# Patient Record
Sex: Male | Born: 1970 | Hispanic: No | Marital: Married | Smoking: Current some day smoker
Health system: Southern US, Community
[De-identification: ages and names within clinical notes are randomized; demographics above are authoritative.]

## PROBLEM LIST (undated history)

## (undated) ENCOUNTER — Ambulatory Visit: Admission: EM

## (undated) DIAGNOSIS — F4310 Post-traumatic stress disorder, unspecified: Secondary | ICD-10-CM

## (undated) DIAGNOSIS — R413 Other amnesia: Secondary | ICD-10-CM

## (undated) DIAGNOSIS — E119 Type 2 diabetes mellitus without complications: Secondary | ICD-10-CM

## (undated) DIAGNOSIS — M2669 Other specified disorders of temporomandibular joint: Secondary | ICD-10-CM

## (undated) DIAGNOSIS — R519 Headache, unspecified: Secondary | ICD-10-CM

## (undated) DIAGNOSIS — E7800 Pure hypercholesterolemia, unspecified: Secondary | ICD-10-CM

## (undated) DIAGNOSIS — E538 Deficiency of other specified B group vitamins: Secondary | ICD-10-CM

## (undated) DIAGNOSIS — K219 Gastro-esophageal reflux disease without esophagitis: Secondary | ICD-10-CM

## (undated) DIAGNOSIS — R41840 Attention and concentration deficit: Secondary | ICD-10-CM

## (undated) DIAGNOSIS — R42 Dizziness and giddiness: Secondary | ICD-10-CM

## (undated) HISTORY — DX: Gastro-esophageal reflux disease without esophagitis: K21.9

## (undated) HISTORY — DX: Type 2 diabetes mellitus without complications: E11.9

## (undated) HISTORY — DX: Deficiency of other specified B group vitamins: E53.8

## (undated) HISTORY — DX: Headache, unspecified: R51.9

## (undated) HISTORY — DX: Other amnesia: R41.3

## (undated) HISTORY — DX: Attention and concentration deficit: R41.840

## (undated) HISTORY — DX: Dizziness and giddiness: R42

## (undated) HISTORY — DX: Post-traumatic stress disorder, unspecified: F43.10

## (undated) HISTORY — DX: Pure hypercholesterolemia, unspecified: E78.00

## (undated) HISTORY — DX: Other specified disorders of temporomandibular joint: M26.69

---

## 2004-04-04 HISTORY — PX: NASAL SEPTUM SURGERY: SHX37

## 2015-05-21 ENCOUNTER — Other Ambulatory Visit: Payer: Self-pay | Admitting: Family Medicine

## 2015-05-21 ENCOUNTER — Ambulatory Visit
Admission: RE | Admit: 2015-05-21 | Discharge: 2015-05-21 | Disposition: A | Discharge status: Home / Self Care | Payer: Medicaid Other | Source: Ambulatory Visit | Attending: Family Medicine | Admitting: Family Medicine

## 2015-05-21 DIAGNOSIS — M5430 Sciatica, unspecified side: Secondary | ICD-10-CM

## 2016-05-06 ENCOUNTER — Ambulatory Visit (INDEPENDENT_AMBULATORY_CARE_PROVIDER_SITE_OTHER): Payer: Self-pay | Admitting: Internal Medicine

## 2016-05-06 ENCOUNTER — Encounter: Payer: Self-pay | Admitting: Internal Medicine

## 2016-05-06 VITALS — BP 122/80 | HR 70 | Resp 12 | Ht 67.0 in | Wt 238.0 lb

## 2016-05-06 DIAGNOSIS — M545 Low back pain: Secondary | ICD-10-CM

## 2016-05-06 DIAGNOSIS — H547 Unspecified visual loss: Secondary | ICD-10-CM | POA: Insufficient documentation

## 2016-05-06 DIAGNOSIS — G8929 Other chronic pain: Secondary | ICD-10-CM

## 2016-05-06 DIAGNOSIS — M79605 Pain in left leg: Secondary | ICD-10-CM | POA: Insufficient documentation

## 2016-05-06 DIAGNOSIS — M79604 Pain in right leg: Secondary | ICD-10-CM

## 2016-05-06 DIAGNOSIS — H6983 Other specified disorders of Eustachian tube, bilateral: Secondary | ICD-10-CM

## 2016-05-06 NOTE — Progress Notes (Signed)
Subjective:    Patient ID: Jeffrey Henry, male    DOB: [birth year], 46 y.o.   MRN: [id number]  HPI   Here to establish.  1.  Leg and knee pain:  Started 3 weeks ago during cold snap.  Bilateral anterior tibial area, then moved to involve knees bilaterally.  Rubbed olive oil to the area and helped a bit.  Anterior knee also involved.  No swelling. Hurts most on cold days and when he is standing.    2.  Decreased visual acuity:  Close vision is blurry.  Needs to hold things away from him.  Has not tried reading glasses.    3.  Hearing concerns:  Had cold symptoms about 1 month ago and feels ears are plugged since--pressure sense.  No "popping".  Gives him a headache.  He does use qtips in ears every day.  4.  Back pain:  Chronic issue.  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   Has been evaluated by scan in past from clinic on [address] and was told there were no concerns.  He did have Lumbar films 2/217 that were unremarkable and noted in his chart.   He went to PT once, but Medicaid did not cover for more and would like to get back to that. No numbness, tingling or weakness in legs or feet.  Current Meds  Medication Sig  . cholecalciferol (VITAMIN D) 1000 units tablet Take 1,000 Units by mouth once.   No Known Allergies    PMH/PSH:   Unremarkable.  Social History   Social History  . Marital status: Married    Spouse name: Beverely Low  . Number of children: 2  . Years of education: 6   Occupational History  . Photographer    Social History Main Topics  . Smoking status: Never Smoker  . Smokeless tobacco: Current User     Comment: smokes hookah ocassionally   . Alcohol use No  . Drug use: No  . Sexual activity: Not on file   Other Topics Concern  . Not on file   Social History Narrative   Originally from [place]   Came to [employer] [place]. In 2016.   Lives at home with wife and 2 children    5 or 6 children:  1 died at age 63--killed by a bomb.   2-3 died in infancy--became weak and not clear what cause.     Review of Systems     Objective:   Physical Exam  NAD HEENT:  PERRL, EOMI, discs sharp.   TMs with scarring somewhat on right, but without thickening or erythema.  Left TM pearly gray.  Throat without injection.  Significant wear of teeth. Neck:  Supple, No adenopathy Chest:  CTA CV:  RRR with normal S1 and S2, No S3, S4 or murmur.  Radial pulses and DP pulses normal and equal. Back:  NT over spinous processes, tender over paraspinous musculature, L/S spine area.  Full ROM. Legs/knees:  No erythema or swelling of pretib area or knees.  Full ROM.  No tenderness or laxity with stress of cruciate or collateral ligaments.  NT on compression of patella against anterior knee joint. Neuro:  A & O X3, CN II-XII grossly intact, DTRs 2+/4 throughout, motor 5/5 throughout, sensory grossly normal.        Assessment & Plan:  1.  Low back pain:  Referral to [facility].  Ibuprofen 400 to  800 mg every 6 hours as needed for pain.  2.  Knee and anterior tibial pain:  Ibuprofen as above, plus menthol and camphor rub to areas as needed as seems associated with cold weather.  3.  Eustachian tube dysfunction:  Discussed should resolved with time.  If no improvement in 1 month, referral for audiology.  4.  Decreased Visual Acuity:  Optometry referral.  Suspect will need bifocals.

## 2016-05-06 NOTE — Patient Instructions (Addendum)
Menthol and camphor containing muscle/joint rub to legs and knees as needed Ibuprofen 200 mg 2-4 tabs by mouth every 6 hours as needed with food for pain Call clinic in 1 month if your hearing does not improve

## 2016-05-15 IMAGING — CR DG LUMBAR SPINE COMPLETE 4+V
5 series · 5 of 5 positions shown · non-contrast
Comparison: None.

CLINICAL DATA: Chronic low back pain with bilateral sciatica. Pain
radiating to both feet.

EXAM:
LUMBAR SPINE - COMPLETE 4+ VIEW

[w lumbar spine ap]
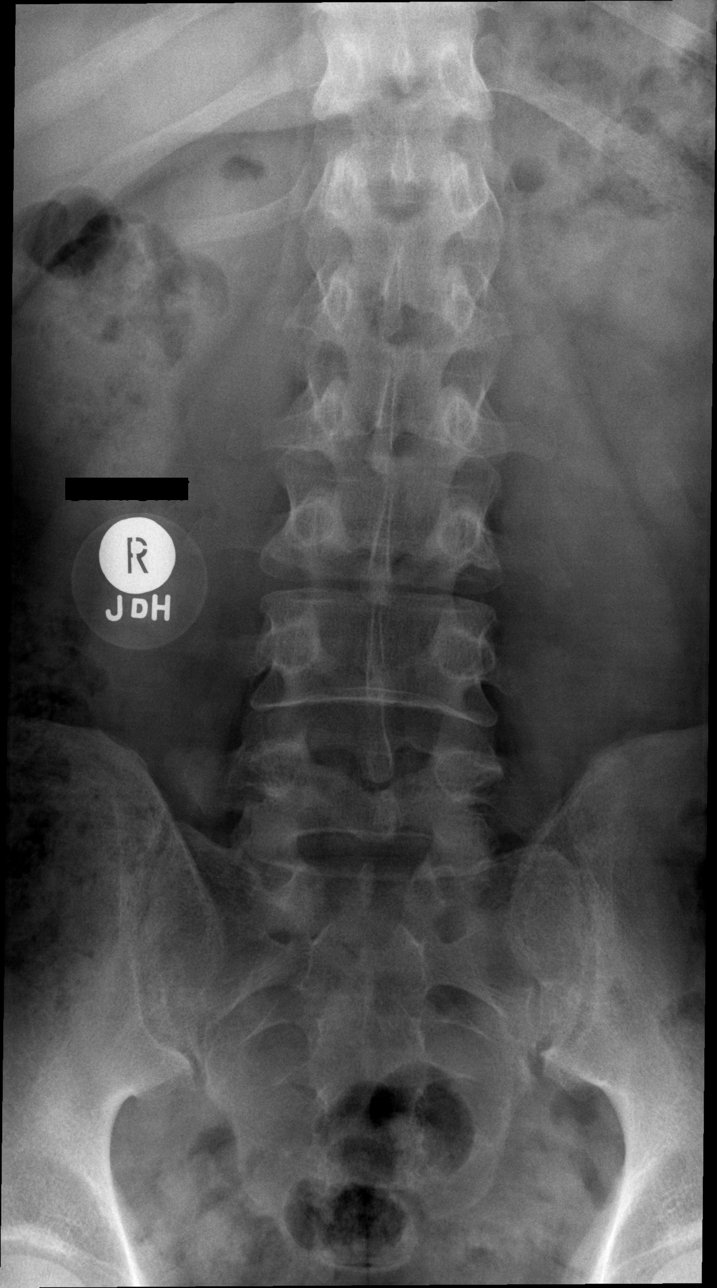

[w lumbar spine obl (1 of 2)]
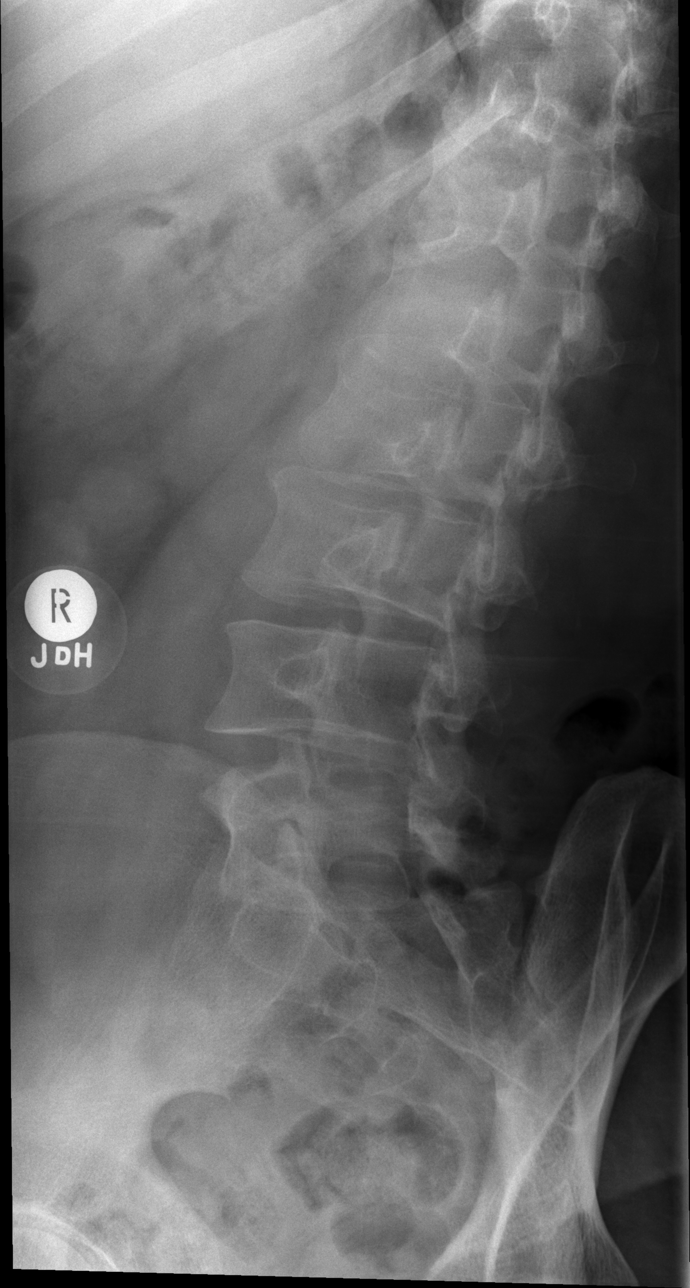

[w lumbar spine obl (2 of 2)]
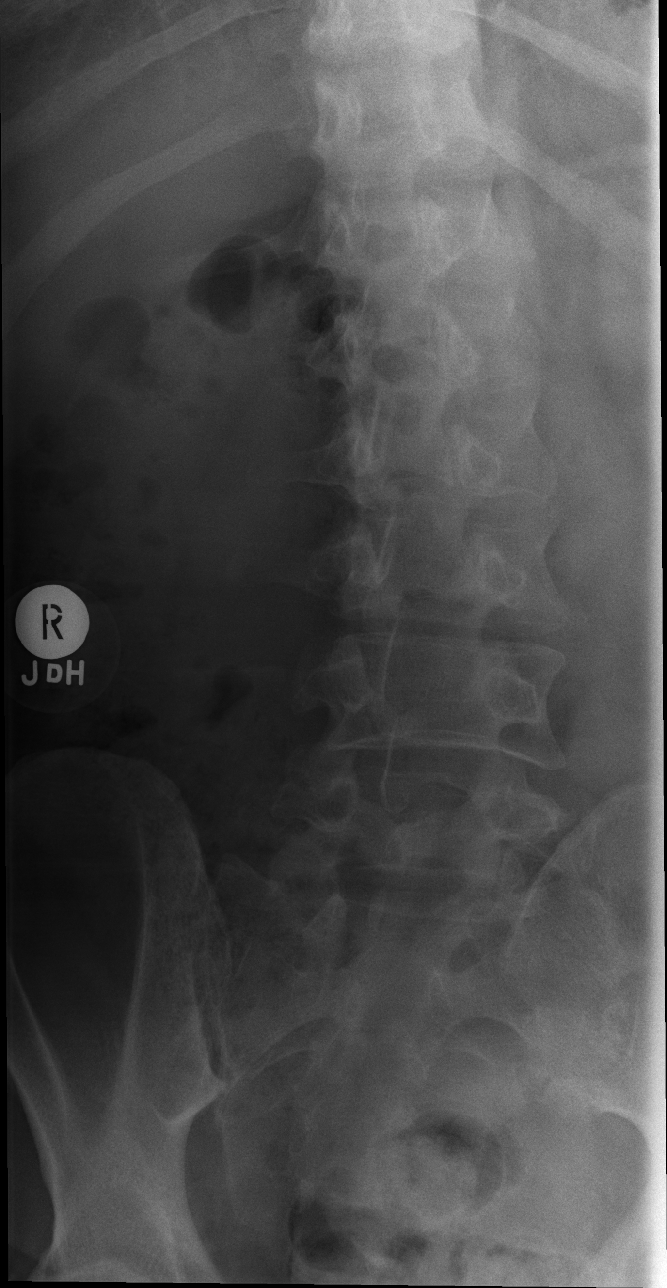

[w lumbar spine lat]
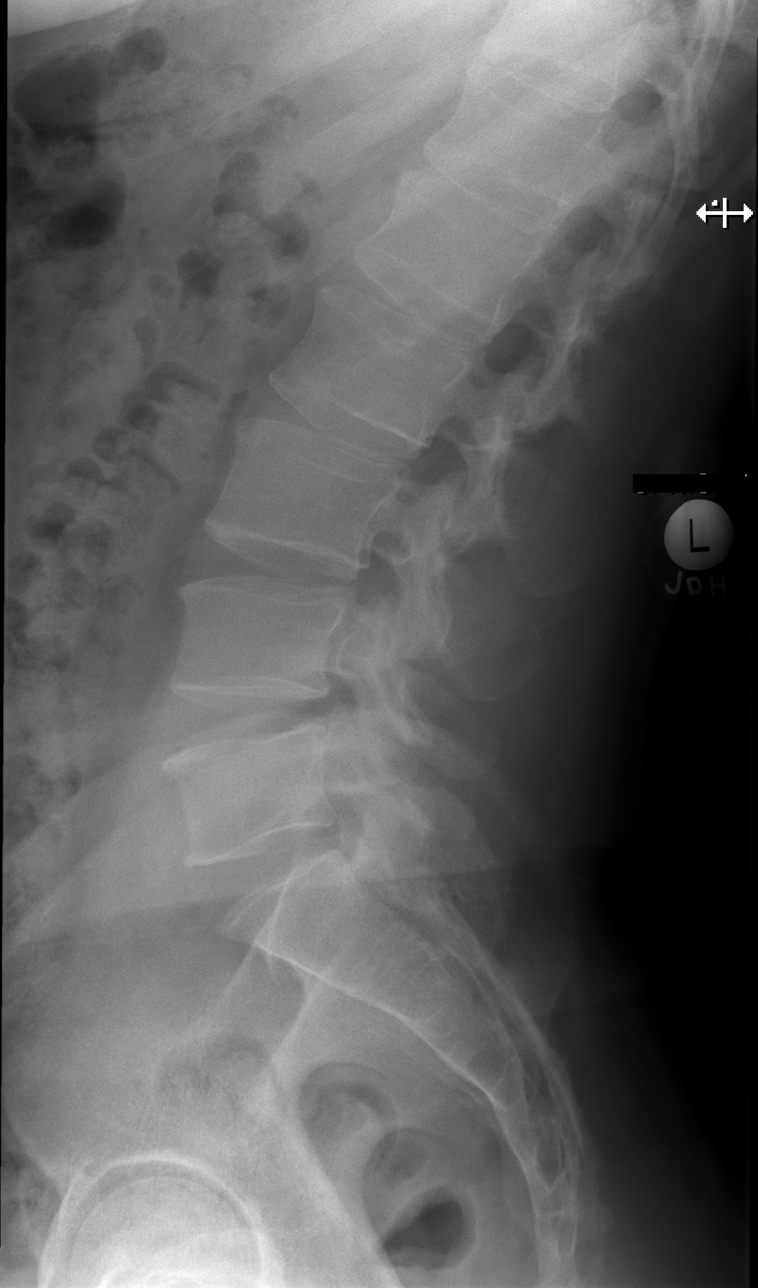

[w lumbar l-5 s-1 spot]
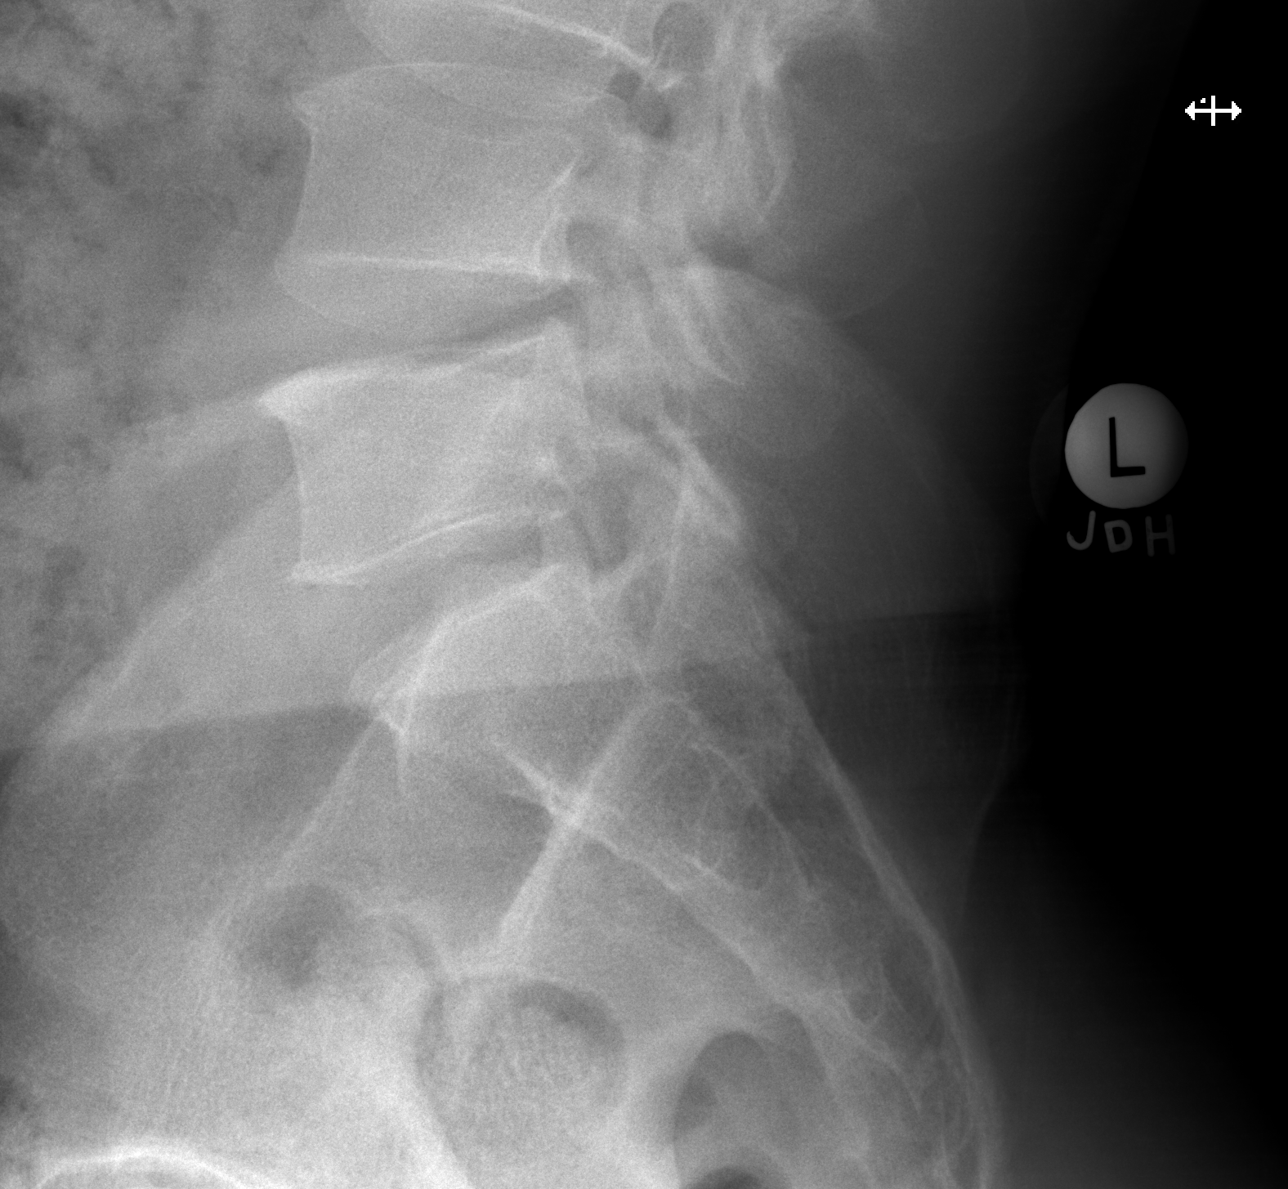

[5 of 5 positions shown; findings below may reference images not displayed]

FINDINGS: There is no evidence of lumbar spine fracture. Alignment is normal.
Intervertebral disc spaces are maintained. No significant facet
arthropathy or other bone lesions identified.
IMPRESSION: Negative lumbar spine radiographs.

## 2016-06-03 ENCOUNTER — Ambulatory Visit: Payer: Self-pay | Admitting: Internal Medicine

## 2016-07-05 ENCOUNTER — Encounter: Payer: Self-pay | Admitting: Internal Medicine

## 2016-07-05 ENCOUNTER — Ambulatory Visit (INDEPENDENT_AMBULATORY_CARE_PROVIDER_SITE_OTHER): Payer: Self-pay | Admitting: Internal Medicine

## 2016-07-05 VITALS — BP 112/70 | HR 72 | Resp 12 | Ht 67.0 in | Wt 236.0 lb

## 2016-07-05 DIAGNOSIS — K047 Periapical abscess without sinus: Secondary | ICD-10-CM

## 2016-07-05 DIAGNOSIS — M2669 Other specified disorders of temporomandibular joint: Secondary | ICD-10-CM

## 2016-07-05 DIAGNOSIS — M26649 Arthritis of unspecified temporomandibular joint: Secondary | ICD-10-CM

## 2016-07-05 HISTORY — DX: Arthritis of unspecified temporomandibular joint: M26.649

## 2016-07-05 MED ORDER — PENICILLIN V POTASSIUM 250 MG PO TABS: 250.0000 mg | ORAL_TABLET | Freq: Four times a day (QID) | ORAL | 0 refills | Status: DC

## 2016-07-05 MED ORDER — IBUPROFEN 200 MG PO TABS: ORAL_TABLET | ORAL | 0 refills | Status: DC

## 2016-07-05 NOTE — Patient Instructions (Signed)
Ask for Bite block to wear at night with sleep at [facility]

## 2016-07-05 NOTE — Progress Notes (Signed)
   Subjective:    Patient ID: Jeffrey Henry, male    DOB: 12-Dec-1970, 46 y.o.   MRN: [id number]  HPI   Jeffrey Henry, interprets.    1.  Pain in right upper premolar for 2 days.  Has been to dentist in past for same tooth.  Sounds like had a cavity that was cleared and filled, not a root canal.  Filling has since fallen out. States went to the dentist for this 2 months ago on his own.   Taking Ibuprofen 400 mg when pain is severe.  Helps a little.   Has not noted any pustular drainage or swelling/redness of gingiva.  Feels like he is getting food caught in area where filling fell out--causes pain.  Having difficulty cleaning his teeth as hurts to clean.  2.  He states his hearing is better, though still not normal out of right ear.  Has not been congested since last seen on 05/06/2016.  No outpatient prescriptions have been marked as taking for the 07/05/16 encounter (Office Visit) with Julieanne Manson, MD.   No Known Allergies  .      Review of Systems     Objective:   Physical Exam HEENT:  PERRL, EOMI, TMs with scarring a bit on right, but without inflammation and clear, left is pearly gray.  Throat without injection, Does have a hole that looks fairly clean in a right upper premolar.  No swelling or fluctuance, but pain with manipulation of tooth.  Bite surfaces well worn. Audible crunch with opening of mouth--emanated from left TMJ Neck:  Supple No adenopathy Chest:  CTA CV: RRR without murmur or rub, radial pulses normal and equal       Assessment & Plan:  1.  Dental abscess with exposed cavity, right upper premolar.  Penicillin V K 250 mg 4 times daily for 7 days.  Dental referral.  Ibuprofen for pain.  2.  Right decreased hearing:  Eustachian tube dysfunction is likely main cause.  If does not continue to improve, will send to audiology.  3.  Left TMJ:  Shares he had this injected in the past.  Discussed getting a bite block to wear at night--denies grinding teeth,  but suspect he does.

## 2016-07-23 ENCOUNTER — Encounter: Payer: Self-pay | Admitting: Internal Medicine

## 2016-08-05 ENCOUNTER — Ambulatory Visit (INDEPENDENT_AMBULATORY_CARE_PROVIDER_SITE_OTHER): Payer: Self-pay | Admitting: Internal Medicine

## 2016-08-05 ENCOUNTER — Encounter: Payer: Self-pay | Admitting: Internal Medicine

## 2016-08-05 VITALS — BP 112/78 | HR 72 | Resp 12 | Ht 67.0 in | Wt 235.0 lb

## 2016-08-05 DIAGNOSIS — G8929 Other chronic pain: Secondary | ICD-10-CM

## 2016-08-05 DIAGNOSIS — H547 Unspecified visual loss: Secondary | ICD-10-CM

## 2016-08-05 DIAGNOSIS — H6983 Other specified disorders of Eustachian tube, bilateral: Secondary | ICD-10-CM

## 2016-08-05 DIAGNOSIS — M545 Low back pain: Secondary | ICD-10-CM

## 2016-08-05 DIAGNOSIS — J3089 Other allergic rhinitis: Secondary | ICD-10-CM

## 2016-08-05 DIAGNOSIS — M79604 Pain in right leg: Secondary | ICD-10-CM

## 2016-08-05 DIAGNOSIS — M79605 Pain in left leg: Secondary | ICD-10-CM

## 2016-08-05 DIAGNOSIS — Z720 Tobacco use: Secondary | ICD-10-CM

## 2016-08-05 DIAGNOSIS — H6980 Other specified disorders of Eustachian tube, unspecified ear: Secondary | ICD-10-CM | POA: Insufficient documentation

## 2016-08-05 MED ORDER — CYCLOBENZAPRINE HCL 5 MG PO TABS: ORAL_TABLET | ORAL | 1 refills | Status: DC

## 2016-08-05 MED ORDER — MOMETASONE FUROATE 50 MCG/ACT NA SUSP: NASAL | 12 refills | Status: DC

## 2016-08-05 NOTE — Progress Notes (Signed)
   Subjective:    Patient ID: Jeffrey Henry, male    DOB: Dec 05, 1970, 46 y.o.   MRN: [id number]  HPI   1.  Dental infection/pain:  Better since antibiotics.  Was seen at dental clinic and planning for fillings to be place.    2.  Right hearing decreased:  Better, though still feels pressure.  States at about 70%.  No throat irritation.  No sinus or nasal congestion.    3.  Hookah smoking:  4-5 times daily when not at work.  3 times if working.  Sits and watches TV often as nothing to do.   4.  Back pain:  [facility]:  TENS unit, exercises.  Seems to have made this worse.  Having problems now sleeping with pain. Now having pain also with any walking.  That did not bother him before.  No radiation really into legs.  Cannot remember name of therapist.    5.  Bilateral knee pain :  Improved.  No consistent as before.  Topical muscle rubs helped.    6.  Decreased visual acuity:  Did get glasses.  Bifocals.  Has not gotten used to them yet.  No outpatient prescriptions have been marked as taking for the 08/05/16 encounter (Office Visit) with Julieanne MansonElizabeth Elham Fini, MD.    No Known Allergies Review of Systems     Objective:   Physical Exam  Constitutional: He is oriented to person, place, and time.  overweight  HENT:  Head: Normocephalic and atraumatic.  Right Ear: Tympanic membrane is retracted (right most prominent).  Left Ear: Tympanic membrane is retracted.  Nose: Mucosal edema present.  Mouth/Throat: Uvula is midline and oropharynx is clear and moist.  Eyes: Conjunctivae and EOM are normal. Pupils are equal, round, and reactive to light.  Neck: Normal range of motion and full passive range of motion without pain. Neck supple.  Cardiovascular: Normal rate, regular rhythm, S1 normal and S2 normal.  Exam reveals no S3, no S4 and no friction rub.   No murmur heard. Pulmonary/Chest: Effort normal and breath sounds normal.  Abdominal: Soft. Bowel sounds are  normal. He exhibits no mass. There is no tenderness.  Musculoskeletal:  NT over L/S spinous processes.  Tender over right more so than left paraspinous musculature with right musculature spasm  Lymphadenopathy:    He has no cervical adenopathy.  Neurological: He is alert and oriented to person, place, and time. He has normal reflexes. No cranial nerve deficit. Coordination normal.         Assessment & Plan:  1.  Dental:  Improved.  Cavities being addressed.  2.  Vision:  Encouraged to use bifocals regularly so gets used to them  3.  Allergies:  Nasonex 2 sprays each nostril daily.    4.  Eustachian tube dysfunction, R>L.  Improving.  5.  Low back pain:  Muscular.  PT he feels is not helpful. Not clear how much he is actually doing at home with exercises.  Encouraged him not to decrease activity.  Flexeril for bedtime See if have received old records with reportedly normal MRI Referral to [facility] as seems to have failed PT.    6.  Knee pain:  Improved

## 2017-01-13 ENCOUNTER — Ambulatory Visit: Payer: Self-pay

## 2017-01-23 ENCOUNTER — Ambulatory Visit (INDEPENDENT_AMBULATORY_CARE_PROVIDER_SITE_OTHER): Payer: Self-pay

## 2017-01-23 DIAGNOSIS — Z23 Encounter for immunization: Secondary | ICD-10-CM

## 2017-03-17 ENCOUNTER — Ambulatory Visit: Payer: Self-pay | Admitting: Internal Medicine

## 2017-04-27 ENCOUNTER — Emergency Department (HOSPITAL_COMMUNITY)
Admission: EM | Admit: 2017-04-27 | Discharge: 2017-04-27 | Disposition: A | Discharge status: Home / Self Care | Payer: Self-pay | Attending: Emergency Medicine | Admitting: Emergency Medicine

## 2017-04-27 ENCOUNTER — Encounter (HOSPITAL_COMMUNITY): Payer: Self-pay

## 2017-04-27 ENCOUNTER — Other Ambulatory Visit: Payer: Self-pay

## 2017-04-27 DIAGNOSIS — N200 Calculus of kidney: Secondary | ICD-10-CM | POA: Insufficient documentation

## 2017-04-27 DIAGNOSIS — Z79899 Other long term (current) drug therapy: Secondary | ICD-10-CM | POA: Insufficient documentation

## 2017-04-27 LAB — URINALYSIS, ROUTINE W REFLEX MICROSCOPIC
Bilirubin Urine: NEGATIVE
GLUCOSE, UA: NEGATIVE mg/dL
KETONES UR: NEGATIVE mg/dL
Leukocytes, UA: NEGATIVE
Nitrite: NEGATIVE
PROTEIN: NEGATIVE mg/dL
SQUAMOUS EPITHELIAL / LPF: NONE SEEN
Specific Gravity, Urine: 1.01 (ref 1.005–1.030)
pH: 7 (ref 5.0–8.0)

## 2017-04-27 MED ORDER — ONDANSETRON HCL 4 MG/2ML IJ SOLN: 4.0000 mg | Freq: Once | INTRAMUSCULAR | Status: DC

## 2017-04-27 MED ORDER — SODIUM CHLORIDE 0.9 % IV BOLUS (SEPSIS): 1000.0000 mL | Freq: Once | INTRAVENOUS | Status: DC

## 2017-04-27 MED ORDER — KETOROLAC TROMETHAMINE 30 MG/ML IJ SOLN: 30.0000 mg | Freq: Once | INTRAMUSCULAR | Status: DC

## 2017-04-27 MED ORDER — TAMSULOSIN HCL 0.4 MG PO CAPS: 0.4000 mg | ORAL_CAPSULE | Freq: Every day | ORAL | 0 refills | Status: AC

## 2017-04-27 NOTE — ED Notes (Signed)
Wall-E used to assist in translating discharge paperwork and go over d/c instructions

## 2017-04-27 NOTE — ED Notes (Signed)
Bed: WA01 Expected date:  Expected time:  Means of arrival:  Comments: 

## 2017-04-27 NOTE — ED Triage Notes (Signed)
Per EMS-states right flank pain radiating to back with dysuria since this am-states genital pain as well-no nausea-vomited this am

## 2017-04-27 NOTE — ED Provider Notes (Signed)
[facility]-EMERGENCY DEPT Provider Note   CSN: [id number] Arrival date & time: 04/27/17  1147     History   Chief Complaint Chief Complaint  Patient presents with  . Flank Pain    HPI Jeffrey Henry is a 47 y.o. male.  HPI   Right side kidney pain, hurts in the back, radiates to front but is less severe in front Also has some dysuria 8 or 10AM had pain with urination Testicles hurt, no history of trauma Had hx of kidney stones 10 yrs ago, had to remove from urethra Now urinated it improved Now pain has resolved except some pain in testicles 2 hours ago was in excrutiating pain and took ibuprofen and it helped Reports pain has completely resolved now  Used arabic interpreter  Past Medical History:  Diagnosis Date  . 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 Eustachian tube dysfunction 08/05/2016  . 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 Decreased visual acuity 05/06/2016  . Chronic midline low back pain without sciatica 05/06/2016  . Pain in both lower extremities 05/06/2016    Past Surgical History:  Procedure Laterality Date  . NASAL SEPTUM SURGERY  2006   [place]       Home Medications    Prior to Admission medications   Medication Sig Start Date End Date Taking? Authorizing Provider  cyclobenzaprine (FLEXERIL) 5 MG tablet 1-2 tabs by mouth at bedtime for back pain Patient not taking: Reported on 04/27/2017 08/05/16   Julieanne Manson, MD  mometasone (NASONEX) 50 MCG/ACT nasal spray 2 sprays each nostril daily Patient not taking: Reported on 04/27/2017 08/05/16   Julieanne Manson, MD  tamsulosin (FLOMAX) 0.4 MG CAPS capsule Take 1 capsule (0.4 mg total) by mouth daily. 04/27/17   Alvira Monday, MD    Family History Family History  Problem Relation Age of Onset  . Diabetes Mother   . Heart disease Mother   . Hyperlipidemia Mother   . Hypertension Mother   . Diabetes Father   . Peripheral Artery  Disease Father     Social History Social History   Tobacco Use  . Smoking status: Never Smoker  . Smokeless tobacco: Current User  . Tobacco comment: smokes hookah daily  Substance Use Topics  . Alcohol use: No  . Drug use: No     Allergies   Patient has no known allergies.   Review of Systems Review of Systems  Constitutional: Negative for fever.  HENT: Negative for sore throat.   Eyes: Negative for visual disturbance.  Respiratory: Negative for shortness of breath.   Cardiovascular: Negative for chest pain.  Gastrointestinal: Positive for nausea (with pain). Negative for abdominal pain and vomiting.  Genitourinary: Positive for dysuria, flank pain and testicular pain. Negative for difficulty urinating.  Musculoskeletal: Negative for back pain and neck stiffness.  Skin: Negative for rash.  Neurological: Negative for syncope and headaches.     Physical Exam Updated Vital Signs BP (!) 149/98 (BP Location: Right Arm)   Pulse 70   Temp 98.1 F (36.7 C) (Oral)   Resp 17   Ht 5\' 11"  (1.803 m)   Wt 110 kg (242 lb 8.1 oz)   SpO2 98%   BMI 33.82 kg/m   Physical Exam  Constitutional: He is oriented to person, place, and time. He appears well-developed and well-nourished. No distress.  HENT:  Head: Normocephalic and atraumatic.  Eyes: Conjunctivae and EOM are normal.  Neck: Normal range  of motion.  Cardiovascular: Normal rate, regular rhythm, normal heart sounds and intact distal pulses. Exam reveals no gallop and no friction rub.  No murmur heard. Pulmonary/Chest: Effort normal and breath sounds normal. No respiratory distress. He has no wheezes. He has no rales.  Abdominal: Soft. He exhibits no distension. There is no tenderness. There is no guarding.  Genitourinary: Testes normal. Cremasteric reflex is present. Right testis shows no tenderness. Left testis shows no tenderness.  Musculoskeletal: He exhibits no edema.  Neurological: He is alert and oriented to  person, place, and time.  Skin: Skin is warm and dry. He is not diaphoretic.  Nursing note and vitals reviewed.    ED Treatments / Results  Labs (all labs ordered are listed, but only abnormal results are displayed) Labs Reviewed  URINALYSIS, ROUTINE W REFLEX MICROSCOPIC - Abnormal; Notable for the following components:      Result Value   Hgb urine dipstick LARGE (*)    Bacteria, UA RARE (*)    All other components within normal limits  URINE CULTURE    EKG  EKG Interpretation None       Radiology No results found.  Procedures Procedures (including critical care time)  Medications Ordered in ED Medications - No data to display   Initial Impression / Assessment and Plan / ED Course  I have reviewed the triage vital signs and the nursing notes.  Pertinent labs & imaging results that were available during my care of the patient were reviewed by me and considered in my medical decision making (see chart for details).     47 yo male who speaks arabic presents with concern for right flank pain and dysuria. UA shows no sign of infection, does show hematuria.  Lab work done which was within normal limits, that his pain resolved after taking ibuprofen in the waiting room.  Given patient pain free at this time, I do not feel that further imaging versus care.  Discussed that patient likely by clinical history of nephrolithiasis, possibly passed a kidney stone.  Patient states understanding. No sign of cholecystitis, appendicitis, testicular pathology. He was given a prescription for Flomax and number for urology for follow-up. Patient discharged in stable condition with understanding of reasons to return.   Final Clinical Impressions(s) / ED Diagnoses   Final diagnoses:  Nephrolithiasis    ED Discharge Orders        Ordered    tamsulosin (FLOMAX) 0.4 MG CAPS capsule  Daily     04/27/17 1448       Alvira MondaySchlossman, Jayci Ellefson, MD 04/27/17 1806

## 2017-04-28 LAB — URINE CULTURE: Culture: NO GROWTH

## 2018-02-25 ENCOUNTER — Other Ambulatory Visit: Payer: Self-pay

## 2018-02-25 ENCOUNTER — Encounter (HOSPITAL_COMMUNITY): Payer: Self-pay

## 2018-02-25 ENCOUNTER — Ambulatory Visit (HOSPITAL_COMMUNITY)
Admission: EM | Admit: 2018-02-25 | Discharge: 2018-02-25 | Disposition: A | Discharge status: Home / Self Care | Payer: Self-pay | Attending: Family Medicine | Admitting: Family Medicine

## 2018-02-25 DIAGNOSIS — G8929 Other chronic pain: Secondary | ICD-10-CM | POA: Insufficient documentation

## 2018-02-25 DIAGNOSIS — Z79899 Other long term (current) drug therapy: Secondary | ICD-10-CM | POA: Insufficient documentation

## 2018-02-25 DIAGNOSIS — M545 Low back pain: Secondary | ICD-10-CM | POA: Insufficient documentation

## 2018-02-25 DIAGNOSIS — J029 Acute pharyngitis, unspecified: Secondary | ICD-10-CM | POA: Insufficient documentation

## 2018-02-25 DIAGNOSIS — K219 Gastro-esophageal reflux disease without esophagitis: Secondary | ICD-10-CM | POA: Insufficient documentation

## 2018-02-25 DIAGNOSIS — Z7951 Long term (current) use of inhaled steroids: Secondary | ICD-10-CM | POA: Insufficient documentation

## 2018-02-25 DIAGNOSIS — M26629 Arthralgia of temporomandibular joint, unspecified side: Secondary | ICD-10-CM | POA: Insufficient documentation

## 2018-02-25 DIAGNOSIS — Z8249 Family history of ischemic heart disease and other diseases of the circulatory system: Secondary | ICD-10-CM | POA: Insufficient documentation

## 2018-02-25 LAB — POCT RAPID STREP A: Streptococcus, Group A Screen (Direct): NEGATIVE

## 2018-02-25 MED ORDER — FLUTICASONE PROPIONATE 50 MCG/ACT NA SUSP: 2.0000 | Freq: Every day | NASAL | 0 refills | Status: DC

## 2018-02-25 MED ORDER — OMEPRAZOLE 20 MG PO CPDR: 20.0000 mg | DELAYED_RELEASE_CAPSULE | Freq: Every day | ORAL | 0 refills | Status: AC

## 2018-02-25 MED ORDER — IPRATROPIUM BROMIDE 0.06 % NA SOLN: 2.0000 | Freq: Four times a day (QID) | NASAL | 0 refills | Status: AC

## 2018-02-25 NOTE — ED Triage Notes (Signed)
Pt cc sore throat. X 10 days

## 2018-02-25 NOTE — Discharge Instructions (Addendum)
Start omeprazole as directed for the next 2 weeks to help with acid reflux. Avoid spicy foods. Sit up right after eating for 30-60 minutes to prevent infection.  Start flonase, atrovent nasal spray for nasal congestion/drainage. You can use over the counter nasal saline rinse such as neti pot for nasal congestion. Keep hydrated, your urine should be clear to pale yellow in color. Tylenol/motrin for fever and pain. Monitor for any worsening of symptoms, chest pain, shortness of breath, wheezing, swelling of the throat, follow up for reevaluation.   For sore throat/cough try using a honey-based tea. Use 3 teaspoons of honey with juice squeezed from half lemon. Place shaved pieces of ginger into 1/2-1 cup of water and warm over stove top. Then mix the ingredients and repeat every 4 hours as needed.

## 2018-02-25 NOTE — ED Provider Notes (Signed)
[facility]    CSN: [id number] Arrival date & time: 02/25/18  1332     History   Chief Complaint Chief Complaint  Patient presents with  . Sore Throat    HPI Jeffrey Henry is a 47 y.o. male.   47 year old male comes in for 10 days of sore throat. HPI obtained by patient through video translator. Difficulty swallowing, feels like swelling to the airway. States there is burning sensation up the chest. Denies rhinorrhea, nasal congestion, cough. Denies fever, chills, night sweats. Denies abdominal pain, nausea, vomiting. Still eating and drinking without difficulty. Has not tried anything for the symptoms.   Tarek # [id number]     Past Medical History:  Diagnosis Date  . 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 Eustachian tube dysfunction 08/05/2016  . 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 Decreased visual acuity 05/06/2016  . Chronic midline low back pain without sciatica 05/06/2016  . Pain in both lower extremities 05/06/2016    Past Surgical History:  Procedure Laterality Date  . NASAL SEPTUM SURGERY  2006   [place]       Home Medications    Prior to Admission medications   Medication Sig Start Date End Date Taking? Authorizing Provider  cyclobenzaprine (FLEXERIL) 5 MG tablet 1-2 tabs by mouth at bedtime for back pain Patient not taking: Reported on 04/27/2017 08/05/16   Julieanne Manson, MD  fluticasone [facility]) 50 MCG/ACT nasal spray Place 2 sprays into both nostrils daily. 02/25/18   Cathie Hoops, Laray Corbit V, PA-C  ipratropium (ATROVENT) 0.06 % nasal spray Place 2 sprays into both nostrils 4 (four) times daily. 02/25/18   Cathie Hoops, Milferd Ansell V, PA-C  omeprazole (PRILOSEC) 20 MG capsule Take 1 capsule (20 mg total) by mouth daily. 02/25/18   Cathie Hoops, Venora Kautzman V, PA-C  tamsulosin (FLOMAX) 0.4 MG CAPS capsule Take 1 capsule (0.4 mg total) by mouth daily. 04/27/17   Alvira Monday, MD    Family History Family History  Problem Relation Age of Onset  .  Diabetes Mother   . Heart disease Mother   . Hyperlipidemia Mother   . Hypertension Mother   . Diabetes Father   . Peripheral Artery Disease Father     Social History Social History   Tobacco Use  . Smoking status: Never Smoker  . Smokeless tobacco: Current User  . Tobacco comment: smokes hookah daily  Substance Use Topics  . Alcohol use: No  . Drug use: No     Allergies   Patient has no known allergies.   Review of Systems Review of Systems  Reason unable to perform ROS: See HPI as above.     Physical Exam Triage Vital Signs ED Triage Vitals [02/25/18 1501]  Enc Vitals Group     BP (!) 153/79     Pulse Rate 80     Resp 18     Temp 98 F (36.7 C)     Temp Source Oral     SpO2 100 %     Weight 253 lb (114.8 kg)     Height      Head Circumference      Peak Flow      Pain Score 6     Pain Loc      Pain Edu?      Excl. in GC?    No data found.  Updated Vital Signs BP (!) 153/79 (BP Location: Right Arm)   Pulse 80  Temp 98 F (36.7 C) (Oral)   Resp 18   Wt 253 lb (114.8 kg)   SpO2 100%   BMI 35.29 kg/m   Physical Exam  Constitutional: He is oriented to person, place, and time. He appears well-developed and well-nourished. No distress.  HENT:  Head: Normocephalic and atraumatic.  Right Ear: Tympanic membrane, external ear and ear canal normal. Tympanic membrane is not erythematous and not bulging.  Left Ear: Tympanic membrane, external ear and ear canal normal. Tympanic membrane is not erythematous and not bulging.  Nose: Nose normal. Right sinus exhibits no maxillary sinus tenderness and no frontal sinus tenderness. Left sinus exhibits no maxillary sinus tenderness and no frontal sinus tenderness.  Mouth/Throat: Uvula is midline, oropharynx is clear and moist and mucous membranes are normal. Tonsils are 1+ on the right. Tonsils are 1+ on the left. No tonsillar exudate.  Eyes: Pupils are equal, round, and reactive to light. Conjunctivae are normal.    Neck: Normal range of motion. Neck supple.  Cardiovascular: Normal rate, regular rhythm and normal heart sounds. Exam reveals no gallop and no friction rub.  No murmur heard. Pulmonary/Chest: Effort normal and breath sounds normal. He has no decreased breath sounds. He has no wheezes. He has no rhonchi. He has no rales.  Lymphadenopathy:    He has no cervical adenopathy.  Neurological: He is alert and oriented to person, place, and time.  Skin: Skin is warm and dry.  Psychiatric: He has a normal mood and affect. His behavior is normal. Judgment normal.     UC Treatments / Results  Labs (all labs ordered are listed, but only abnormal results are displayed) Labs Reviewed  CULTURE, GROUP A STREP [facility])  POCT RAPID STREP A    EKG None  Radiology No results found.  Procedures Procedures (including critical care time)  Medications Ordered in UC Medications - No data to display  Initial Impression / Assessment and Plan / UC Course  I have reviewed the triage vital signs and the nursing notes.  Pertinent labs & imaging results that were available during my care of the patient were reviewed by me and considered in my medical decision making (see chart for details).    Discussed possible acid reflux vs post nasal drainage causing symptoms. Will start omeprazole for acid reflux and flonase/atrovent nasal spray for the nose. Discussed foods to avoid with acid reflux. Patient to follow up with PCP for further evaluation and management needed if symptoms not improving.  Final Clinical Impressions(s) / UC Diagnoses   Final diagnoses:  Sore throat  Gastroesophageal reflux disease without esophagitis    ED Prescriptions    Medication Sig Dispense Auth. Provider   omeprazole (PRILOSEC) 20 MG capsule Take 1 capsule (20 mg total) by mouth daily. 30 capsule Edison Wollschlager V, PA-C   fluticasone (FLONASE) 50 MCG/ACT nasal spray Place 2 sprays into both nostrils daily. 1 g Jahlil Ziller V, PA-C    ipratropium (ATROVENT) 0.06 % nasal spray Place 2 sprays into both nostrils 4 (four) times daily. 15 mL Threasa AlphaYu, Jessee Newnam V, PA-C        Isair Inabinet V, [place]PA-C 02/25/18 1540

## 2018-02-28 LAB — CULTURE, GROUP A STREP (THRC)

## 2018-06-21 ENCOUNTER — Other Ambulatory Visit: Payer: Self-pay

## 2018-06-21 ENCOUNTER — Ambulatory Visit (HOSPITAL_COMMUNITY)
Admission: EM | Admit: 2018-06-21 | Discharge: 2018-06-21 | Disposition: A | Discharge status: Home / Self Care | Payer: BLUE CROSS/BLUE SHIELD | Attending: Family Medicine | Admitting: Family Medicine

## 2018-06-21 ENCOUNTER — Encounter (HOSPITAL_COMMUNITY): Payer: Self-pay

## 2018-06-21 DIAGNOSIS — J029 Acute pharyngitis, unspecified: Secondary | ICD-10-CM | POA: Diagnosis not present

## 2018-06-21 LAB — POCT RAPID STREP A: Streptococcus, Group A Screen (Direct): NEGATIVE

## 2018-06-21 MED ORDER — CETIRIZINE HCL 10 MG PO TABS: 10.0000 mg | ORAL_TABLET | Freq: Every day | ORAL | 0 refills | Status: DC

## 2018-06-21 MED ORDER — FLUTICASONE PROPIONATE 50 MCG/ACT NA SUSP: 2.0000 | Freq: Every day | NASAL | 1 refills | Status: DC

## 2018-06-21 NOTE — Discharge Instructions (Signed)
I believe that your symptoms are allergy related.  Zyrtec daily and Flonase nasal spray Follow up as needed for continued or worsening symptoms  aetaqid 'an 'aeradak murtabitatan bialhasasiat. Zyrtec yawmiaan waradhadh al'anf Flonase tabie hsb alhajat lil'aerad almustamirat 'aw almutafaqima

## 2018-06-21 NOTE — ED Notes (Signed)
Patient verbalizes understanding of discharge instructions. Opportunity for questioning and answers were provided. Patient discharged from [facility] by physician.

## 2018-06-21 NOTE — ED Triage Notes (Signed)
Patient presents to Urgent Care with complaints of sore throat, right ear pain, and slight cough since about a week ago. Patient's primary language is Arabic.

## 2018-06-21 NOTE — ED Provider Notes (Signed)
[facility]    CSN: [id number] Arrival date & time: 06/21/18  [id number]     History   Chief Complaint Chief Complaint  Patient presents with  . Sore Throat  . Otalgia    HPI Jeffrey Henry is a 48 y.o. male.   Pt is a 48 year old male that presents with sore throat, left ear discomfort.  This has been present and remained the same for approximately 1 week.  Reporting a sensation of something stuck in his throat and having to clear his throat.  He does work outside.  He has had similar symptoms in the past and used Flonase nasal spray with some relief.  He has not taken anything for his symptoms currently.  Denies any cough, chest congestion, fevers, chills, night sweats.  Denies any recent traveling or recent sick contacts.  ROS per HPI    Sore Throat   Otalgia    Past Medical History:  Diagnosis Date  . 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 Eustachian tube dysfunction 08/05/2016  . 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 Decreased visual acuity 05/06/2016  . Chronic midline low back pain without sciatica 05/06/2016  . Pain in both lower extremities 05/06/2016    Past Surgical History:  Procedure Laterality Date  . NASAL SEPTUM SURGERY  2006   [place]       Home Medications    Prior to Admission medications   Medication Sig Start Date End Date Taking? Authorizing Provider  cetirizine (ZYRTEC) 10 MG tablet Take 1 tablet (10 mg total) by mouth daily. 06/21/18   Dahlia ByesBast, Kery Haltiwanger A, NP  cyclobenzaprine (FLEXERIL) 5 MG tablet 1-2 tabs by mouth at bedtime for back pain Patient not taking: Reported on 04/27/2017 08/05/16   Julieanne MansonMulberry, Elizabeth, MD  fluticasone [facility](FLONASE) 50 MCG/ACT nasal spray Place 2 sprays into both nostrils daily. 06/21/18   Cornesha Radziewicz, Gloris Manchesterraci A, NP  ipratropium (ATROVENT) 0.06 % nasal spray Place 2 sprays into both nostrils 4 (four) times daily. 02/25/18   Cathie HoopsYu, Amy V, PA-C  omeprazole (PRILOSEC) 20 MG capsule Take 1 capsule  (20 mg total) by mouth daily. 02/25/18   Cathie HoopsYu, Amy V, PA-C  tamsulosin (FLOMAX) 0.4 MG CAPS capsule Take 1 capsule (0.4 mg total) by mouth daily. 04/27/17   Alvira MondaySchlossman, Erin, MD    Family History Family History  Problem Relation Age of Onset  . Diabetes Mother   . Heart disease Mother   . Hyperlipidemia Mother   . Hypertension Mother   . Diabetes Father   . Peripheral Artery Disease Father     Social History Social History   Tobacco Use  . Smoking status: Never Smoker  . Smokeless tobacco: Current User  . Tobacco comment: smokes hookah daily  Substance Use Topics  . Alcohol use: No  . Drug use: No     Allergies   Patient has no known allergies.   Review of Systems Review of Systems  HENT: Positive for ear pain.      Physical Exam Triage Vital Signs ED Triage Vitals  Enc Vitals Group     BP 06/21/18 0901 (!) 149/83     Pulse Rate 06/21/18 0901 77     Resp 06/21/18 0901 18     Temp 06/21/18 0901 98.2 F (36.8 C)     Temp src --      SpO2 06/21/18 0901 100 %     Weight --  Height --      Head Circumference --      Peak Flow --      Pain Score 06/21/18 0859 3     Pain Loc --      Pain Edu? --      Excl. in GC? --    No data found.  Updated Vital Signs BP (!) 149/83 (BP Location: Left Arm)   Pulse 77   Temp 98.2 F (36.8 C)   Resp 18   SpO2 100%   Visual Acuity Right Eye Distance:   Left Eye Distance:   Bilateral Distance:    Right Eye Near:   Left Eye Near:    Bilateral Near:     Physical Exam Vitals signs and nursing note reviewed.  Constitutional:      General: He is not in acute distress.    Appearance: He is well-developed. He is not ill-appearing or toxic-appearing.  HENT:     Head: Normocephalic and atraumatic.     Right Ear: Tympanic membrane and ear canal normal.     Left Ear: A middle ear effusion is present.     Nose: Congestion present.     Mouth/Throat:     Pharynx: Posterior oropharyngeal erythema present.     Tonsils:  Tonsillar exudate present. 1+ on the right. 1+ on the left.  Eyes:     Conjunctiva/sclera: Conjunctivae normal.  Neck:     Musculoskeletal: Normal range of motion.     Thyroid: No thyromegaly.  Cardiovascular:     Rate and Rhythm: Normal rate and regular rhythm.  Pulmonary:     Effort: Pulmonary effort is normal.     Breath sounds: Normal breath sounds.  Skin:    General: Skin is warm and dry.  Neurological:     Mental Status: He is alert.  Psychiatric:        Mood and Affect: Mood normal.      UC Treatments / Results  Labs (all labs ordered are listed, but only abnormal results are displayed) Labs Reviewed  CULTURE, GROUP A STREP [facility])  POCT RAPID STREP A    EKG None  Radiology No results found.  Procedures Procedures (including critical care time)  Medications Ordered in UC Medications - No data to display  Initial Impression / Assessment and Plan / UC Course  I have reviewed the triage vital signs and the nursing notes.  Pertinent labs & imaging results that were available during my care of the patient were reviewed by me and considered in my medical decision making (see chart for details).     Symptoms consistent with seasonal allergies versus viral illness. Most likely allergies We will treat for allergies with Flonase and Zyrtec daily  Rapid strep test negative.  Follow up as needed for continued or worsening symptoms   Final Clinical Impressions(s) / UC Diagnoses   Final diagnoses:  Sore throat     Discharge Instructions     I believe that your symptoms are allergy related.  Zyrtec daily and Flonase nasal spray Follow up as needed for continued or worsening symptoms  aetaqid 'an 'aeradak murtabitatan bialhasasiat. Zyrtec yawmiaan waradhadh al'anf Flonase tabie hsb alhajat lil'aerad almustamirat 'aw almutafaqima    ED Prescriptions    Medication Sig Dispense Auth. Provider   fluticasone (FLONASE) 50 MCG/ACT nasal spray Place 2 sprays  into both nostrils daily. 1 g Izzabell Klasen A, NP   cetirizine (ZYRTEC) 10 MG tablet Take 1 tablet (10 mg total) by mouth  daily. 30 tablet Dahlia Byes A, NP     Controlled Substance Prescriptions [place] Controlled Substance Registry consulted? Not Applicable   Janace Aris, NP 06/21/18 1030

## 2018-06-23 LAB — CULTURE, GROUP A STREP (THRC)

## 2018-07-06 ENCOUNTER — Emergency Department (HOSPITAL_COMMUNITY)
Admission: EM | Admit: 2018-07-06 | Discharge: 2018-07-07 | Disposition: A | Discharge status: Home / Self Care | Payer: BLUE CROSS/BLUE SHIELD | Attending: Emergency Medicine | Admitting: Emergency Medicine

## 2018-07-06 ENCOUNTER — Encounter (HOSPITAL_COMMUNITY): Payer: Self-pay | Admitting: Emergency Medicine

## 2018-07-06 ENCOUNTER — Other Ambulatory Visit: Payer: Self-pay

## 2018-07-06 DIAGNOSIS — Z79899 Other long term (current) drug therapy: Secondary | ICD-10-CM | POA: Insufficient documentation

## 2018-07-06 DIAGNOSIS — R079 Chest pain, unspecified: Secondary | ICD-10-CM

## 2018-07-06 DIAGNOSIS — M545 Low back pain: Secondary | ICD-10-CM | POA: Diagnosis not present

## 2018-07-06 DIAGNOSIS — R590 Localized enlarged lymph nodes: Secondary | ICD-10-CM | POA: Insufficient documentation

## 2018-07-06 DIAGNOSIS — R0789 Other chest pain: Secondary | ICD-10-CM | POA: Insufficient documentation

## 2018-07-06 DIAGNOSIS — K219 Gastro-esophageal reflux disease without esophagitis: Secondary | ICD-10-CM | POA: Diagnosis not present

## 2018-07-06 DIAGNOSIS — F17200 Nicotine dependence, unspecified, uncomplicated: Secondary | ICD-10-CM | POA: Insufficient documentation

## 2018-07-06 DIAGNOSIS — G8929 Other chronic pain: Secondary | ICD-10-CM | POA: Diagnosis not present

## 2018-07-06 DIAGNOSIS — H9203 Otalgia, bilateral: Secondary | ICD-10-CM | POA: Diagnosis not present

## 2018-07-06 DIAGNOSIS — R7989 Other specified abnormal findings of blood chemistry: Secondary | ICD-10-CM | POA: Insufficient documentation

## 2018-07-06 DIAGNOSIS — J019 Acute sinusitis, unspecified: Secondary | ICD-10-CM | POA: Diagnosis not present

## 2018-07-06 DIAGNOSIS — J029 Acute pharyngitis, unspecified: Secondary | ICD-10-CM | POA: Diagnosis present

## 2018-07-06 LAB — CBC
HCT: 51.8 % (ref 39.0–52.0)
Hemoglobin: 18.2 g/dL — ABNORMAL HIGH (ref 13.0–17.0)
MCH: 31 pg (ref 26.0–34.0)
MCHC: 35.1 g/dL (ref 30.0–36.0)
MCV: 88.2 fL (ref 80.0–100.0)
Platelets: 244 10*3/uL (ref 150–400)
RBC: 5.87 MIL/uL — ABNORMAL HIGH (ref 4.22–5.81)
RDW: 13 % (ref 11.5–15.5)
WBC: 10.4 10*3/uL (ref 4.0–10.5)
nRBC: 0 % (ref 0.0–0.2)

## 2018-07-06 LAB — BASIC METABOLIC PANEL
Anion gap: 10 (ref 5–15)
BUN: 17 mg/dL (ref 6–20)
CO2: 26 mmol/L (ref 22–32)
Calcium: 9.6 mg/dL (ref 8.9–10.3)
Chloride: 102 mmol/L (ref 98–111)
Creatinine, Ser: 1.55 mg/dL — ABNORMAL HIGH (ref 0.61–1.24)
GFR calc Af Amer: >60 mL/min (ref >60)
GFR calc non Af Amer: 52 mL/min — ABNORMAL LOW (ref >60)
Glucose, Bld: 167 mg/dL — ABNORMAL HIGH (ref 70–99)
Potassium: 4.1 mmol/L (ref 3.5–5.1)
Sodium: 138 mmol/L (ref 135–145)

## 2018-07-06 LAB — TROPONIN I: Troponin I: <0.03 ng/mL (ref <0.03)

## 2018-07-06 MED ORDER — SODIUM CHLORIDE 0.9% FLUSH: 3.0000 mL | Freq: Once | INTRAVENOUS | Status: DC

## 2018-07-06 NOTE — ED Triage Notes (Signed)
Pt c/o chest pain and sore throat, seen before for same, advised to return if symptoms progressed. NAD noted at this time. States he has been using nasal spray for congestion with no change.

## 2018-07-07 ENCOUNTER — Emergency Department (HOSPITAL_COMMUNITY): Payer: BLUE CROSS/BLUE SHIELD

## 2018-07-07 LAB — GROUP A STREP BY PCR: Group A Strep by PCR: NOT DETECTED

## 2018-07-07 MED ORDER — AMOXICILLIN-POT CLAVULANATE 875-125 MG PO TABS: 1.0000 | ORAL_TABLET | Freq: Two times a day (BID) | ORAL | 0 refills | Status: AC

## 2018-07-07 NOTE — ED Notes (Signed)
Patient transported to X-ray 

## 2018-07-07 NOTE — Discharge Instructions (Signed)
????? ?????? ?? ??????? ????? ?? ??? ???????   ???? ???? ??????? ?????? ?? ????? ????? ?? ???? ?????? ???????? ??? ?? ??? ?????? ??? ?? ?? ???? ??? ???? ?? ?????? ??????? ????? ???? ???? ??? ????? ?????? ?????? ?? ?????.  ????? ???? ????? ??? ???? ?? Augmentin ????? ?????? ???? ?????? ?????? ???????.  Augmentin ?? ???? ????.  ?? ????? ?? ???? ???? ????? ?? ??? ??????.  ?? ????? ?? ??? ?????? ??? ?? ???? ??????? ???.  ????? ????????? ?? ??????? ???????? ????? ????? ????????????? ???? ???????? ???????? ???????? ?????? ?????? ??.  ??? ?????? ?? ??????? ????? ?????? ??? ??????? ?? ??????? ?????.    1. ??? ??? ?? ?? ??? ????? ??????? ?????? ???? ??? ???? ??????? ?? ???? ??????? ?????.  ??? ????? ?? ?? ???? ????? ?? ???? ??? ????? ?? ???? ????? ????? ? ?? ?????? ?????? ???? ????? ?? ???? ??? ER.  ????? ??? ?? ????? ?? ????? ?? ???? ?????.  ??? ???? ??????? ?????? ?????.  ???? ??????? ??? ?????? ???? ????????.  ??? ????? ?? ?? ???? ???????? ???? ????? ???? ????? ?? ???? ?? ??? ??????? ?? ???????? ???? ????? ??????? ????? ?? ??? ????????? ?? ?????? ??? ????? ??? ?? ???? ??? ??? ??? ???? ??? ?? ????? ??? ????? ???? ???? ?????? ??? ?? ????? ???? ?? ???? ?? ?????? ??????? ?? ???????? ??? ??? ?????? ???? ????? ?? ??? ??? ???? ???? ??? ?? ?????? ? ??????? ??? ??? ??? ???? ????? ????? ?? ??? ???? ??? ??? ?? ??? ?? ????? ???? ??? ?? ???? ??? ????? ??? ER.  2. ????? ??????????? ????? ?? ????? ??? ?????? ??? ??? ???? ????? ??.  ??????????? ?? ?????? ?????? ?? ???? ???? ???? ??? ???? ????? ???? ???.  ???? ?? ???? ???? ??????? ??????? ????? ?? ????? ??? ??? ??????? ?? ???????? ??????? 1-2 ?????? ?? ??? ?????.  ??? ??? ?? ??? ???? ??????? ????????? ???? ?? ????? ?? ????? ?? ????? ?????? ???? ?????.  ?????? ??? ??? ??????? ??? ????? ?? ????? ?????? ??? ??? ??? ???? ????? ?? ???? ?? ?????? ??? ??? ????? ?? ?????? ???? ??????? ?? ???? ????? ?? ?????? ?? ????? ?? ??????? ???????? ???????? ????????.  Thank you for allowing me to  care for you today in the Emergency Department.   Most of your symptoms today were consistent with a sinus infection, including the feeling as if something is stuck in your throat, for sore throat that goes down into your chest, and the pressure and pain in your ears.  To treat this, take 1 tablet of Augmentin 2 times daily for the next 10 days.  Augmentin is an antibiotic.  It is important you take the entire course of this medication.  Do not stop this medication even if your symptoms resolve.  You can continue to use your Flonase and ipratropium nose sprays as directed and your other home medications.  I also want you to follow-up for 2 other things, as we discussed, from your visit today.    I am concerned the pressure-like chest pain that has been coming and going may be related to your heart.  Although your work-up today did not demonstrate that you are having a heart attack today, I cannot diagnose you with heart disease in the ER.  I think it is important that you follow-up with a cardiologist.  I have provided the office information above.  Please call them and schedule follow-up appointment.  Although your work-up regarding your heart today  did not show any reason for me to call a heart doctor tonight, admit you to the hospital, or talk to a surgeon, you should know that if you have pressure-like chest pain that becomes constant, chest pain that is worse with exercise or exertion, if it significantly worsens, or if you start having shortness of breath, sweating, if you pass out, feel weak or clammy along with chest pain that you should immediately return to the ER.  2. Also, your creatinine today was elevated on your blood work.  Creatinine is a test that we used to look at to see how well your kidneys are functioning.  Please have your primary care physician recheck this level in the next 1 to 2 weeks to ensure that it is improving.  I do not have any previous blood work to compare it to so it is  important that you follow-up with your regular doctor for this.  Return to the emergency department if you stop producing urine, if you develop a large amount of blood in your urine, if you have a high fever with back or abdominal pain, or other new, concerning symptoms.

## 2018-07-07 NOTE — ED Provider Notes (Signed)
[facility] EMERGENCY DEPARTMENT Provider Note   CSN: [id number] Arrival date & time: 07/06/18  2247    History   Chief Complaint Chief Complaint  Patient presents with  . Chest Pain    HPI Jeffrey Henry is a 48 y.o. male with a history of eustachian tube dysfunction and nephrolithiasis who presents to the emergency department with a chief complaint of sore throat.  The patient reports a sore throat with the pain that he characterizes as burning that radiates down into his chest for the last week.  He reports an associated globus sensation, nasal congestion, and reports symptoms are worse when he awakens in the morning, but he is unsure if he is having postnasal drip.  He reports he is also been having some pressure and pain in his ears that began just prior to the onset of the sore throat.  Reports he has been snoring more over the last week since his other symptoms began. He reports that his symptoms initially seemed to improve, but have worsened over the last day.   He denies fever, chills, headache, visual changes, rhinorrhea, sneezing, trismus, drooling, muffled voice, or facial or neck swelling.  He reports a history of nasal septum surgery several years ago.  He has been treating his symptoms with omeprazole without improvement.  He also reports he has been having some intermittent left-sided chest pain that is nonradiating.  He characterizes the pain as "pressure-like".  He reports the pain will last for a few seconds before resolving.  He is unsure of how long this pain has been ongoing.  He denies exertional or pleuritic pain.  No other known aggravating or alleviating factors.  He denies shortness of breath, diaphoresis, radiation down the left arm, neck, or back, cough, orthopnea, leg swelling, weakness, numbness, nausea, vomiting, or abdominal pain.  He does report he has been having some bilateral low back pain, but reports this is chronic.  Primary care  follows him for this.  He reports that he recently had "a lot of blood drawn" at his primary care's office "and it was all good."  Per chart review, these labs are not available in Care Everywhere.  He reports that his past medical history includes GERD and he was previously told that he had a kidney stone.  When asked about his family history, he denied any family history of medical problems, but based on chart review it appears that he has a family history of CAD, diabetes mellitus, HLD, HTN, and PAD.  No history of VTE.  No recent travel or immobilization.     The history is provided by the patient. The history is limited by a language barrier. A language interpreter was used (Arabic).    Past Medical History:  Diagnosis Date  . 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 Eustachian tube dysfunction 08/05/2016  . 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 Decreased visual acuity 05/06/2016  . Chronic midline low back pain without sciatica 05/06/2016  . Pain in both lower extremities 05/06/2016    Past Surgical History:  Procedure Laterality Date  . NASAL SEPTUM SURGERY  2006   [place]        Home Medications    Prior to Admission medications   Medication Sig Start Date End Date Taking? Authorizing Provider  amoxicillin-clavulanate (AUGMENTIN) 875-125 MG tablet Take 1 tablet by mouth every 12 (twelve) hours for 10 days. 07/07/18 07/17/18  Adalai Perl, Coral Else,  PA-C  cetirizine (ZYRTEC) 10 MG tablet Take 1 tablet (10 mg total) by mouth daily. 06/21/18   Bast, Gloris Manchester A, NP  fluticasone (FLONASE) 50 MCG/ACT nasal spray Place 2 sprays into both nostrils daily. 06/21/18   Bast, Gloris Manchester A, NP  ipratropium (ATROVENT) 0.06 % nasal spray Place 2 sprays into both nostrils 4 (four) times daily. 02/25/18   Cathie Hoops, Amy V, PA-C  omeprazole (PRILOSEC) 20 MG capsule Take 1 capsule (20 mg total) by mouth daily. 02/25/18   Cathie Hoops, Amy V, PA-C  tamsulosin (FLOMAX) 0.4 MG CAPS capsule Take 1  capsule (0.4 mg total) by mouth daily. 04/27/17   Alvira Monday, MD    Family History Family History  Problem Relation Age of Onset  . Diabetes Mother   . Heart disease Mother   . Hyperlipidemia Mother   . Hypertension Mother   . Diabetes Father   . Peripheral Artery Disease Father     Social History Social History   Tobacco Use  . Smoking status: Never Smoker  . Smokeless tobacco: Current User  . Tobacco comment: smokes hookah daily  Substance Use Topics  . Alcohol use: No  . Drug use: No     Allergies   Patient has no known allergies.   Review of Systems Review of Systems  Constitutional: Negative for appetite change, chills, fatigue and fever.  HENT: Positive for congestion, ear pain and sore throat. Negative for dental problem, drooling, ear discharge, hearing loss, sneezing and voice change.   Respiratory: Negative for shortness of breath and wheezing.   Cardiovascular: Positive for chest pain. Negative for palpitations and leg swelling.  Gastrointestinal: Negative for abdominal pain, constipation, diarrhea, nausea and vomiting.  Genitourinary: Negative for dysuria and urgency.  Musculoskeletal: Negative for back pain and neck pain.  Skin: Negative for rash.  Allergic/Immunologic: Negative for immunocompromised state.  Neurological: Negative for dizziness, syncope, weakness, numbness and headaches.  Psychiatric/Behavioral: Negative for confusion.     Physical Exam Updated Vital Signs BP 131/78 (BP Location: Right Arm)   Pulse 69   Temp 98 F (36.7 C) (Oral)   Resp 19   SpO2 100%   Physical Exam Vitals signs and nursing note reviewed.  Constitutional:      Appearance: He is well-developed.  HENT:     Head: Normocephalic.     Jaw: There is normal jaw occlusion.     Right Ear: Hearing normal. A middle ear effusion is present. No mastoid tenderness. Tympanic membrane is not perforated or erythematous.     Left Ear: Hearing normal. A middle ear  effusion is present. No mastoid tenderness. Tympanic membrane is not perforated or erythematous.     Nose: Mucosal edema and congestion present. No rhinorrhea.     Right Nostril: No epistaxis or septal hematoma.     Left Nostril: No epistaxis or septal hematoma.     Right Turbinates: Enlarged and swollen. Not pale.     Left Turbinates: Enlarged and swollen. Not pale.     Right Sinus: No maxillary sinus tenderness or frontal sinus tenderness.     Left Sinus: No maxillary sinus tenderness or frontal sinus tenderness.     Mouth/Throat:     Mouth: Mucous membranes are moist. No oral lesions.     Dentition: No dental tenderness, gingival swelling or dental abscesses.     Pharynx: Oropharynx is clear. Uvula midline. Posterior oropharyngeal erythema present. No pharyngeal swelling, oropharyngeal exudate or uvula swelling.     Tonsils: No tonsillar exudate.  Eyes:     General: No scleral icterus.    Conjunctiva/sclera: Conjunctivae normal.  Neck:     Musculoskeletal: Normal range of motion and neck supple.     Comments: Shotty bilateral anterior cervical lymphadenopathy. Single, right posterior cervical rubbery mobile lymph node. Cardiovascular:     Rate and Rhythm: Normal rate and regular rhythm.     Chest Wall: No thrill.     Pulses:          Radial pulses are 2+ on the right side and 2+ on the left side.       Dorsalis pedis pulses are 2+ on the right side and 2+ on the left side.     Heart sounds: Normal heart sounds. No murmur. No friction rub. No gallop. No S3 or S4 sounds.   Pulmonary:     Effort: Pulmonary effort is normal. No tachypnea, accessory muscle usage or respiratory distress.     Breath sounds: Normal breath sounds. No stridor or transmitted upper airway sounds.  Abdominal:     General: There is no distension.     Palpations: Abdomen is soft.     Comments: Abdomen is obese and protuberant, but soft and non-tender.  Musculoskeletal:     Right lower leg: No edema.     Left  lower leg: No edema.  Lymphadenopathy:     Cervical: Cervical adenopathy present.  Skin:    General: Skin is warm and dry.     Capillary Refill: Capillary refill takes less than 2 seconds.     Coloration: Skin is not cyanotic or pale.     Findings: No ecchymosis or erythema.     Nails: There is no clubbing.   Neurological:     Mental Status: He is alert.  Psychiatric:        Behavior: Behavior normal.      ED Treatments / Results  Labs (all labs ordered are listed, but only abnormal results are displayed) Labs Reviewed  BASIC METABOLIC PANEL - Abnormal; Notable for the following components:      Result Value   Glucose, Bld 167 (*)    Creatinine, Ser 1.55 (*)    GFR calc non Af Amer 52 (*)    All other components within normal limits  CBC - Abnormal; Notable for the following components:   RBC 5.87 (*)    Hemoglobin 18.2 (*)    All other components within normal limits  GROUP A STREP BY PCR  TROPONIN I    EKG EKG Interpretation  Date/Time:  Friday July 06 2018 22:04:01 EDT Ventricular Rate:  94 PR Interval:  132 QRS Duration: 90 QT Interval:  346 QTC Calculation: 432 R Axis:   13 Text Interpretation:  Normal sinus rhythm Inferior infarct , age undetermined Cannot rule out Anterior infarct , age undetermined Abnormal ECG s1q3t3 No old tracing to compare Confirmed by Derwood Kaplan ([id number]) on 07/07/2018 1:36:33 AM   Radiology Dg Chest 2 View  Result Date: 07/07/2018 CLINICAL DATA:  Chest pain.  Sore throat. EXAM: CHEST - 2 VIEW COMPARISON:  None. FINDINGS: The heart size and mediastinal contours are within normal limits. Both lungs are clear. The visualized skeletal structures are unremarkable. IMPRESSION: No active cardiopulmonary disease. Electronically Signed   By: Gerome Sam III M.D   On: 07/07/2018 00:11    Procedures Procedures (including critical care time)  Medications Ordered in ED Medications  sodium chloride flush (NS) 0.9 % injection 3 mL (has  no administration in time  range)     Initial Impression / Assessment and Plan / ED Course  I have reviewed the triage vital signs and the nursing notes.  Pertinent labs & imaging results that were available during my care of the patient were reviewed by me and considered in my medical decision making (see chart for details).        48 year old male with a history of eustachian tube dysfunction and nephrolithiasis who presents to the emergency department with bilateral ear pain and pressure for the last week, nasal congestion, and sore throat with globus sensation.  Additionally, the patient also reports that he has been having episodes of pressure-like chest pain, but no other associated symptoms.  He is established with primary care, but primary care records are not available in care everywhere.  The patient was discussed with Dr. Rhunette Croft, attending physician.  I think regarding most of the patient's presenting symptoms that he has sinusitis based on history of present illness and clinical exam.  It sounds as if his symptoms have been recurrent and are now worsened so I think it is reasonable to treat him with a course of Augmentin.  Strep PCR is negative.  Regarding his "pressure-like" chest pain, his EKG demonstrated S1Q3T3.  He is not having any shortness of breath, tachycardia, and he has no pleuritic chest pain.  He is not hypoxic. PERC negative.  His troponin was negative.  Chest x-ray is unremarkable.  BP is currently 131/78. Heart is regular rate and rhythm without murmurs rubs or gallops.  I did offer the patient a second troponin, but he declined further blood work at this time.  I do suspect repeat troponin would be normal given the intermittent chronicity of his pain. However, given his family history, I do feel that he warrants follow-up with cardiology. Doubt ACS and low suspicion for pneumothorax, pneumonia, COVID-19, esophageal rupture, aortic dissection.   Labs are otherwise  remarkable for creatinine of 1.55.    CBC also demonstrates polycythemia with a hemoglobin of 18.2 with elevated red blood cell count of 5.87.  No previous is available for comparison.His BUN to creatinine ratio is approximately 10:1 and less suggestive of prerenal etiology, such as hypovolemia.  I discussed with the patient that I would recommend checking a urinalysis and ordering a liter of IV fluids and then rechecking his creatinine as I was uncertain what his baseline creatinine to determine if this was acute or chronic.  I also discussed with the patient that if his creatinine did not seem to improve with fluids that he may require more of a work-up for his kidneys. He denies urinary symptoms or flank pain at this time.   After this option was presented, the patient declined as he reported that his son was waiting for him in the parking lot due to visitor restriction secondary to COVID-19 pandemic.  I discussed with the patient that it was important to ensure that he had follow-up regarding both his pressure-like chest pain and elevated creatinine.  I discussed that his work-up was not concerning for ACS, but was unable to rule out cardiovascular disease in the ER.  I also discussed that it was very important that he have his creatinine rechecked by primary care.    A referral to cardiology was given on his discharge paperwork.  All conversations with the patient were used with an Arabic interpreter and his discharge instructions were given in both [place] and Arabic.  He was also given strict return precautions to the  emergency department for new or worsening symptoms  Final Clinical Impressions(s) / ED Diagnoses   Final diagnoses:  Acute sinusitis, recurrence not specified, unspecified location  Elevated serum creatinine  Nonspecific chest pain    ED Discharge Orders         Ordered    amoxicillin-clavulanate (AUGMENTIN) 875-125 MG tablet  Every 12 hours     07/07/18 0141            Chyanna Flock A, PA-C 07/07/18 7622    Derwood Kaplan, MD 07/07/18 [phone]

## 2018-07-07 NOTE — ED Notes (Signed)
RN used Arabic interpreter to review discharge paperwork. Pt made aware of need for follow up. RN reviewed prescription with patient.

## 2018-07-10 ENCOUNTER — Ambulatory Visit: Payer: BLUE CROSS/BLUE SHIELD | Admitting: Cardiovascular Disease

## 2018-07-10 ENCOUNTER — Encounter: Payer: Self-pay | Admitting: Cardiovascular Disease

## 2018-07-10 ENCOUNTER — Other Ambulatory Visit: Payer: Self-pay

## 2018-07-10 VITALS — BP 128/82 | HR 72 | Ht 71.0 in | Wt 251.4 lb

## 2018-07-10 DIAGNOSIS — R079 Chest pain, unspecified: Secondary | ICD-10-CM

## 2018-07-10 NOTE — Progress Notes (Signed)
Cardiology Consultation:   Patient ID: Jeffrey Henry MRN: [id number]; DOB: [birth year]  Admit date: (Not on file) Date of Consult: 07/10/2018  Primary Care Provider: Mack Hook, MD Primary Cardiologist: No primary care provider on file. New/Harlee Pursifull Primary Electrophysiologist:  None     Patient Profile:   Jeffrey Henry is a 48 y.o. male with a hx of chest pain  who is being seen today for the evaluation of same  at the request of Mia McDonald PA-C.  History of Present Illness:   Jeffrey Henry 48 y.o. seen in ER 07/07/18 with sore throat and atypical chest pain. Strep testing negative ECG non acute no ST changes isolated Q in lead 3. Did note Hb elevated 18.2 WBC normal   He's had previous nasal septal surgery and has some sinus fullness  For last week has had sore throat with burning pain that radiates down into his chest Worse in am with congestive symptoms Also some pain / pressure in his ears No fever using omeprazole without improvement   He also reports he has been having some intermittent left-sided chest pain that is nonradiating.  He characterizes the pain as "pressure-like".  He reports the pain will last for a few seconds before resolving.  He is unsure of how long this pain has been ongoing.  He denies exertional or pleuritic pain.  No other known aggravating or alleviating factors.  He denies shortness of breath, diaphoresis, radiation down the left arm, neck, or back, cough, orthopnea, leg swelling, weakness, numbness, nausea, vomiting, or abdominal pain.  CXR with normal mediastinum and no CE NAD  From [place] Has wife and 79 yo boy 57 yo girl at home Paints cars for a living  [place] regularly    Past Medical History:  Diagnosis Date  . 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 NASAL SEPTUM SURGERY  2006   [place]     Home Medications:  Prior to Admission medications   Medication Sig Start Date End Date Taking?  Authorizing Provider  amoxicillin-clavulanate (AUGMENTIN) 875-125 MG tablet Take 1 tablet by mouth every 12 (twelve) hours for 10 days. 07/07/18 07/17/18 Yes McDonald, Mia A, PA-C  cetirizine (ZYRTEC) 10 MG tablet Take 1 tablet (10 mg total) by mouth daily. 06/21/18  Yes Bast, Traci A, NP  fluticasone (FLONASE) 50 MCG/ACT nasal spray Place 2 sprays into both nostrils daily. 06/21/18  Yes Bast, Traci A, NP  ipratropium (ATROVENT) 0.06 % nasal spray Place 2 sprays into both nostrils 4 (four) times daily. 02/25/18  Yes Yu, Amy V, PA-C  meloxicam (MOBIC) 15 MG tablet Take 15 mg by mouth daily. 06/18/18 07/18/18 Yes [provider]  omeprazole (PRILOSEC) 20 MG capsule Take 1 capsule (20 mg total) by mouth daily. 02/25/18  Yes Yu, Amy V, PA-C  tamsulosin (FLOMAX) 0.4 MG CAPS capsule Take 1 capsule (0.4 mg total) by mouth daily. 04/27/17  Yes Gareth Morgan, MD    Inpatient Medications: Scheduled Meds:  Continuous Infusions:  PRN Meds:   Allergies:   No Known Allergies  Social History:   Social History   Socioeconomic History  . Marital status: Married    Spouse name: Jonathon Bellows  . Number of children: 2  . Years of education: 6  . Highest education level: Not on file  Occupational History  . Occupation: Patent attorney  Social Needs  . Financial resource strain: Not on file  . Food insecurity:  Worry: Not on file    Inability: Not on file  . Transportation needs:    Medical: Not on file    Non-medical: Not on file  Tobacco Use  . Smoking status: Never Smoker  . Smokeless tobacco: Current User  . Tobacco comment: smokes hookah daily  Substance and Sexual Activity  . Alcohol use: No  . Drug use: No  . Sexual activity: Yes  Lifestyle  . Physical activity:    Days per week: Not on file    Minutes per session: Not on file  . Stress: Not on file  Relationships  . Social connections:    Talks on phone: Not on file    Gets together: Not on file     Attends religious service: Not on file    Active member of club or organization: Not on file    Attends meetings of clubs or organizations: Not on file    Relationship status: Not on file  . Intimate partner violence:    Fear of current or ex partner: Not on file    Emotionally abused: Not on file    Physically abused: Not on file    Forced sexual activity: Not on file  Other Topics Concern  . Not on file  Social History Narrative   Originally from [place]   Came to [place] In 2016.   Lives at home with wife and 2 children    Family History:    Family History  Problem Relation Age of Onset  . Diabetes Mother   . Heart disease Mother   . Hyperlipidemia Mother   . Hypertension Mother   . Diabetes Father   . Peripheral Artery Disease Father      ROS:  Please see the history of present illness.   All other ROS reviewed and negative.     Physical Exam/Data:   Vitals:   07/10/18 1026  BP: 128/82  Pulse: 72  SpO2: 96%  Weight: 114 kg  Height: '5\' 11"'  (1.803 m)   '@IOBRIEF' @ Last 3 Weights 07/10/2018 02/25/2018 04/27/2017  Weight (lbs) 251 lb 6.4 oz 253 lb 242 lb 8.1 oz  Weight (kg) 114.034 kg 114.76 kg 110 kg     Body mass index is 35.06 kg/m.  General:  Overweight syrian male  HEENT: normal Lymph: no adenopathy Neck: no JVD Endocrine:  No thryomegaly Vascular: No carotid bruits; FA pulses 2+ bilaterally without bruits  Cardiac:  normal S1, S2; RRR; no murmur no rub  Lungs:  clear to auscultation bilaterally, no wheezing, rhonchi or rales  Abd: soft, nontender, no hepatomegaly  Ext: no edema Musculoskeletal:  No deformities, BUE and BLE strength normal and equal Skin: warm and dry  Neuro:  CNs 2-12 intact, no focal abnormalities noted Psych:  Normal affect   EKG:  NSR isolated Q in lead 3 no acute ST changes   Relevant CV Studies:  None  Laboratory Data:  Chemistry Recent Labs  Lab 07/06/18 2310  NA 138  K 4.1  CL 102  CO2 26  GLUCOSE 167*  BUN 17   CREATININE 1.55*  CALCIUM 9.6  GFRNONAA 52*  GFRAA >60  ANIONGAP 10    No results for input(s): PROT, ALBUMIN, AST, ALT, ALKPHOS, BILITOT in the last 168 hours. Hematology Recent Labs  Lab 07/06/18 2310  WBC 10.4  RBC 5.87*  HGB 18.2*  HCT 51.8  MCV 88.2  MCH 31.0  MCHC 35.1  RDW 13.0  PLT 244   Cardiac Enzymes Recent  Labs  Lab 07/06/18 2310  TROPONINI <0.03   No results for input(s): TROPIPOC in the last 168 hours.  BNPNo results for input(s): BNP, PROBNP in the last 168 hours.  DDimer No results for input(s): DDIMER in the last 168 hours.  Radiology/Studies:  Dg Chest 2 View  Result Date: 07/07/2018 CLINICAL DATA:  Chest pain.  Sore throat. EXAM: CHEST - 2 VIEW COMPARISON:  None. FINDINGS: The heart size and mediastinal contours are within normal limits. Both lungs are clear. The visualized skeletal structures are unremarkable. IMPRESSION: No active cardiopulmonary disease. Electronically Signed   By: Dorise Bullion III M.D   On: 07/07/2018 00:11    Assessment and Plan:   1. Atypical Chest Pain: Related to URI and sore throat with no acute ECG changes and negative troponin. Will arrange  ETT when COVID 19 restrictions have been lifted Motrin 400 tid for muscular pain No need for CRF, ESR or pericarditis w/u at this time as troponin negative no ECG changes and afebrile with no dyspnea able to walk for an hour 2. Pharyngitis :  Strep negative f/u with primary local Rx with cepacol RX with Augmentin per ER  3. Erythrocytoisis:  Discussed not smoking f/u with primary consider ordering RBC mass  4. RF:  Elevated Cr f/u primary stay hydrated    For questions or updates, please contact [place] Please consult [email or web address] for contact info under     Signed, Jenkins Rouge, MD  07/10/2018 11:04 AM

## 2018-07-10 NOTE — Patient Instructions (Addendum)
Medication Instructions:  Your physician has recommended you make the following change in your medication:  Take Motrin 400 mg by mouth every 8 hours or 3 times a day for 4 weeks.  If you need a refill on your cardiac medications before your next appointment, please call your pharmacy.   Lab work:  If you have labs (blood work) drawn today and your tests are completely normal, you will receive your results only by: Marland Kitchen MyChart Message (if you have MyChart) OR . A paper copy in the mail If you have any lab test that is abnormal or we need to change your treatment, we will call you to review the results.  Testing/Procedures: Your physician has requested that you have an exercise tolerance test in 6 to 8 weeks. For further information please visit [email or web address] Please also follow instruction sheet, as given.  Follow-Up: Please make an appointment with Dr. Delrae Alfred to follow up with your symptoms.   Your physician recommends that you schedule a follow-up appointment in: as needed with Dr. Eden Emms.

## 2019-03-01 ENCOUNTER — Encounter (HOSPITAL_COMMUNITY): Payer: Self-pay

## 2019-03-01 ENCOUNTER — Telehealth (HOSPITAL_COMMUNITY): Payer: Self-pay

## 2019-03-01 ENCOUNTER — Other Ambulatory Visit: Payer: Self-pay

## 2019-03-01 ENCOUNTER — Ambulatory Visit (HOSPITAL_COMMUNITY)
Admission: EM | Admit: 2019-03-01 | Discharge: 2019-03-01 | Disposition: A | Discharge status: Home / Self Care | Payer: BLUE CROSS/BLUE SHIELD | Attending: Emergency Medicine | Admitting: Emergency Medicine

## 2019-03-01 DIAGNOSIS — R42 Dizziness and giddiness: Secondary | ICD-10-CM | POA: Insufficient documentation

## 2019-03-01 DIAGNOSIS — R519 Headache, unspecified: Secondary | ICD-10-CM

## 2019-03-01 DIAGNOSIS — R030 Elevated blood-pressure reading, without diagnosis of hypertension: Secondary | ICD-10-CM | POA: Insufficient documentation

## 2019-03-01 LAB — CBC WITH DIFFERENTIAL/PLATELET
Abs Immature Granulocytes: 0.02 10*3/uL (ref 0.00–0.07)
Basophils Absolute: 0 10*3/uL (ref 0.0–0.1)
Basophils Relative: 1 %
Eosinophils Absolute: 0.1 10*3/uL (ref 0.0–0.5)
Eosinophils Relative: 1 %
HCT: 54.3 % — ABNORMAL HIGH (ref 39.0–52.0)
Hemoglobin: 19.2 g/dL — ABNORMAL HIGH (ref 13.0–17.0)
Immature Granulocytes: 0 %
Lymphocytes Relative: 33 %
Lymphs Abs: 2.6 10*3/uL (ref 0.7–4.0)
MCH: 31.3 pg (ref 26.0–34.0)
MCHC: 35.4 g/dL (ref 30.0–36.0)
MCV: 88.4 fL (ref 80.0–100.0)
Monocytes Absolute: 0.4 10*3/uL (ref 0.1–1.0)
Monocytes Relative: 5 %
Neutro Abs: 4.5 10*3/uL (ref 1.7–7.7)
Neutrophils Relative %: 60 %
Platelets: 238 10*3/uL (ref 150–400)
RBC: 6.14 MIL/uL — ABNORMAL HIGH (ref 4.22–5.81)
RDW: 12.9 % (ref 11.5–15.5)
WBC: 7.6 10*3/uL (ref 4.0–10.5)
nRBC: 0 % (ref 0.0–0.2)

## 2019-03-01 LAB — HEMOGLOBIN A1C
Hgb A1c MFr Bld: 7.1 % — ABNORMAL HIGH (ref 4.8–5.6)
Mean Plasma Glucose: 157.07 mg/dL

## 2019-03-01 LAB — CBG MONITORING, ED: Glucose-Capillary: 114 mg/dL — ABNORMAL HIGH (ref 70–99)

## 2019-03-01 LAB — GLUCOSE, CAPILLARY: Glucose-Capillary: 114 mg/dL — ABNORMAL HIGH (ref 70–99)

## 2019-03-01 MED ORDER — CETIRIZINE HCL 10 MG PO TABS: 10.0000 mg | ORAL_TABLET | Freq: Every day | ORAL | 1 refills | Status: DC

## 2019-03-01 MED ORDER — KETOTIFEN FUMARATE 0.025 % OP SOLN: 1.0000 [drp] | Freq: Two times a day (BID) | OPHTHALMIC | 0 refills | Status: DC

## 2019-03-01 NOTE — Telephone Encounter (Signed)
I returned the call to the pt, to let him know the pharmacy he picked was [facility], [place], [place], but pt did not answered the call and voicemail is full.

## 2019-03-01 NOTE — Discharge Instructions (Addendum)
Patient was advised to follow up with PCP To continue to use Meclizine that was prescribed at the urgent care on [address] Patient BP at recheck was 140/80 Take OTC Tylenol for headache Take medication as prescribed We will call when your lab result becomes available If symptom get worse go to ED

## 2019-03-01 NOTE — ED Provider Notes (Addendum)
[facility]    CSN: [id number] Arrival date & time: 03/01/19  2683      History   Chief Complaint Chief Complaint  Patient presents with  . Headache  . Dizziness    HPI Jeffrey Henry is a 48 y.o. male.   48 year old presented to the clinic with a symptom of headache and dizziness X 3 days . Patient stated he went to the urgent care  [address] with the same symptom and was prescribed meclizine. He didn't complete the medication because he stated the medication wasn't working. He was also told his blood sugar was high and need to be check. He is fasting and want his lab done.  The history is provided by the patient. No language interpreter was used.    Past Medical History:  Diagnosis Date  . 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 Eustachian tube dysfunction 08/05/2016  . 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 Decreased visual acuity 05/06/2016  . Chronic midline low back pain without sciatica 05/06/2016  . Pain in both lower extremities 05/06/2016    Past Surgical History:  Procedure Laterality Date  . NASAL SEPTUM SURGERY  2006   [place]       Home Medications    Prior to Admission medications   Medication Sig Start Date End Date Taking? Authorizing Provider  cetirizine (ZYRTEC ALLERGY) 10 MG tablet Take 1 tablet (10 mg total) by mouth daily. 03/01/19   Furious Chiarelli, Zachery Dakins, FNP  fluticasone (FLONASE) 50 MCG/ACT nasal spray Place 2 sprays into both nostrils daily. 06/21/18   Bast, Gloris Manchester A, NP  ipratropium (ATROVENT) 0.06 % nasal spray Place 2 sprays into both nostrils 4 (four) times daily. 02/25/18   Belinda Fisher, PA-C  ketotifen (THERATEARS ALLERGY) 0.025 % ophthalmic solution Place 1 drop into both eyes 2 (two) times daily. 03/01/19   Rhylei Mcquaig, Zachery Dakins, FNP  omeprazole (PRILOSEC) 20 MG capsule Take 1 capsule (20 mg total) by mouth daily. 02/25/18   Cathie Hoops, Amy V, PA-C  tamsulosin (FLOMAX) 0.4 MG CAPS capsule Take 1  capsule (0.4 mg total) by mouth daily. 04/27/17   Alvira Monday, MD    Family History Family History  Problem Relation Age of Onset  . Diabetes Mother   . Heart disease Mother   . Hyperlipidemia Mother   . Hypertension Mother   . Diabetes Father   . Peripheral Artery Disease Father     Social History Social History   Tobacco Use  . Smoking status: Never Smoker  . Smokeless tobacco: Current User  . Tobacco comment: smokes hookah daily  Substance Use Topics  . Alcohol use: No  . Drug use: No     Allergies   Patient has no known allergies.   Review of Systems Review of Systems  Constitutional: Negative for activity change, appetite change, chills, fatigue and fever.  HENT: Negative.   Eyes: Negative.   Respiratory: Negative for cough, chest tightness and shortness of breath.   Cardiovascular: Negative for chest pain.  ROS: all other are negatives   Physical Exam Triage Vital Signs ED Triage Vitals  Enc Vitals Group     BP 03/01/19 1031 (!) 140/105     Pulse Rate 03/01/19 1031 82     Resp 03/01/19 1031 17     Temp 03/01/19 1031 98.2 F (36.8 C)     Temp Source 03/01/19 1031 Oral     SpO2 03/01/19  1031 99 %     Weight --      Height --      Head Circumference --      Peak Flow --      Pain Score 03/01/19 1038 6     Pain Loc --      Pain Edu? --      Excl. in GC? --    No data found.  Updated Vital Signs BP (!) 140/105 (BP Location: Right Arm)   Pulse 82   Temp 98.2 F (36.8 C) (Oral)   Resp 17   SpO2 99%   Visual Acuity Right Eye Distance:   Left Eye Distance:   Bilateral Distance:    Right Eye Near:   Left Eye Near:    Bilateral Near:     Physical Exam Constitutional:      General: He is not in acute distress.    Appearance: He is not ill-appearing or toxic-appearing.  Cardiovascular:     Rate and Rhythm: Normal rate and regular rhythm.     Pulses: Normal pulses.     Heart sounds: Normal heart sounds. No murmur.  Pulmonary:      Effort: Pulmonary effort is normal. No respiratory distress.     Breath sounds: Normal breath sounds. No wheezing.  Chest:     Chest wall: No tenderness.  Skin:    General: Skin is warm.     Capillary Refill: Capillary refill takes less than 2 seconds.     Coloration: Skin is not jaundiced.     Findings: No rash.  Neurological:     General: No focal deficit present.     Mental Status: He is alert and oriented to person, place, and time. Mental status is at baseline.     Cranial Nerves: No cranial nerve deficit.     Sensory: No sensory deficit.     Motor: No weakness.     Coordination: Coordination is intact.     Gait: Gait is intact.      UC Treatments / Results  Labs (all labs ordered are listed, but only abnormal results are displayed) Labs Reviewed  HEMOGLOBIN A1C  CBC WITH DIFFERENTIAL/PLATELET  COMPREHENSIVE METABOLIC PANEL  GLUCOSE, CAPILLARY  CBG MONITORING, ED    EKG   Radiology No results found.  Procedures Procedures (including critical care time)  Medications Ordered in UC Medications - No data to display  Initial Impression / Assessment and Plan / UC Course  I have reviewed the triage vital signs and the nursing notes.  Pertinent labs & imaging results that were available during my care of the patient were reviewed by me and considered in my medical decision making (see chart for details).  Patient was discharged in stable conditio. BP recheck was 140/80 and was advised to follow up with PCP. CBC,  and HgA1C was completed at today visit. CMP was ordered but he refused to be sticked. We will call if lab is abnormal,  Final Clinical Impressions(s) / UC Diagnoses   Final diagnoses:  Dizziness  Acute intractable headache, unspecified headache type  Elevated blood pressure reading without diagnosis of hypertension     Discharge Instructions     Patient was advised to follow up with PCP To continue to use Meclizine that was prescribed at the urgent  care on [address] Patient BP at recheck was 140/80 Take OTC Tylenol for headache Take medication as prescribed We will call when your lab result becomes available If symptom get worse  go to ED    ED Prescriptions    Medication Sig Dispense Auth. Provider   cetirizine (ZYRTEC ALLERGY) 10 MG tablet Take 1 tablet (10 mg total) by mouth daily. 30 tablet Baley Lorimer, Darrelyn Hillock, FNP   ketotifen (THERATEARS ALLERGY) 0.025 % ophthalmic solution Place 1 drop into both eyes 2 (two) times daily. 5 mL Shanira Tine, Darrelyn Hillock, FNP     PDMP not reviewed this encounter.   Emerson Monte, FNP 03/01/19 1234    Emerson Monte, FNP 03/01/19 1242    Emerson Monte, FNP 03/01/19 1251    Emerson Monte, FNP 03/01/19 1252

## 2019-03-01 NOTE — ED Triage Notes (Signed)
Pt presents with headache and dizziness over past few days.

## 2019-03-02 ENCOUNTER — Telehealth (HOSPITAL_COMMUNITY): Payer: Self-pay

## 2019-03-02 MED ORDER — CETIRIZINE HCL 10 MG PO TABS: 10.0000 mg | ORAL_TABLET | Freq: Every day | ORAL | 1 refills | Status: AC

## 2019-03-02 MED ORDER — KETOTIFEN FUMARATE 0.025 % OP SOLN: 1.0000 [drp] | Freq: Two times a day (BID) | OPHTHALMIC | 0 refills | Status: AC

## 2019-03-04 ENCOUNTER — Telehealth (HOSPITAL_COMMUNITY): Payer: Self-pay | Admitting: Emergency Medicine

## 2019-03-04 NOTE — Telephone Encounter (Signed)
With arabic interpreter, Patient contacted by phone and made aware of    results. Pt verbalized understanding and had all questions answered. Pt is at his PCP office, with his permission, lab results faxed to front desk.

## 2019-03-26 ENCOUNTER — Telehealth: Payer: Self-pay | Admitting: Radiology

## 2019-03-26 NOTE — Telephone Encounter (Signed)
Yes needs ETT

## 2019-03-26 NOTE — Telephone Encounter (Signed)
Exercise treadmill test was ordered back in April 2020 when treadmill room was shut down due to Covid. Please advise if patient still needs test scheduled.  If no longer needed please cancel the order   

## 2019-07-02 IMAGING — DX CHEST - 2 VIEW
2 series · 2 of 2 positions shown · non-contrast
Comparison: None.

CLINICAL DATA: Chest pain.  Sore throat.

EXAM:
CHEST - 2 VIEW

[chest pa]
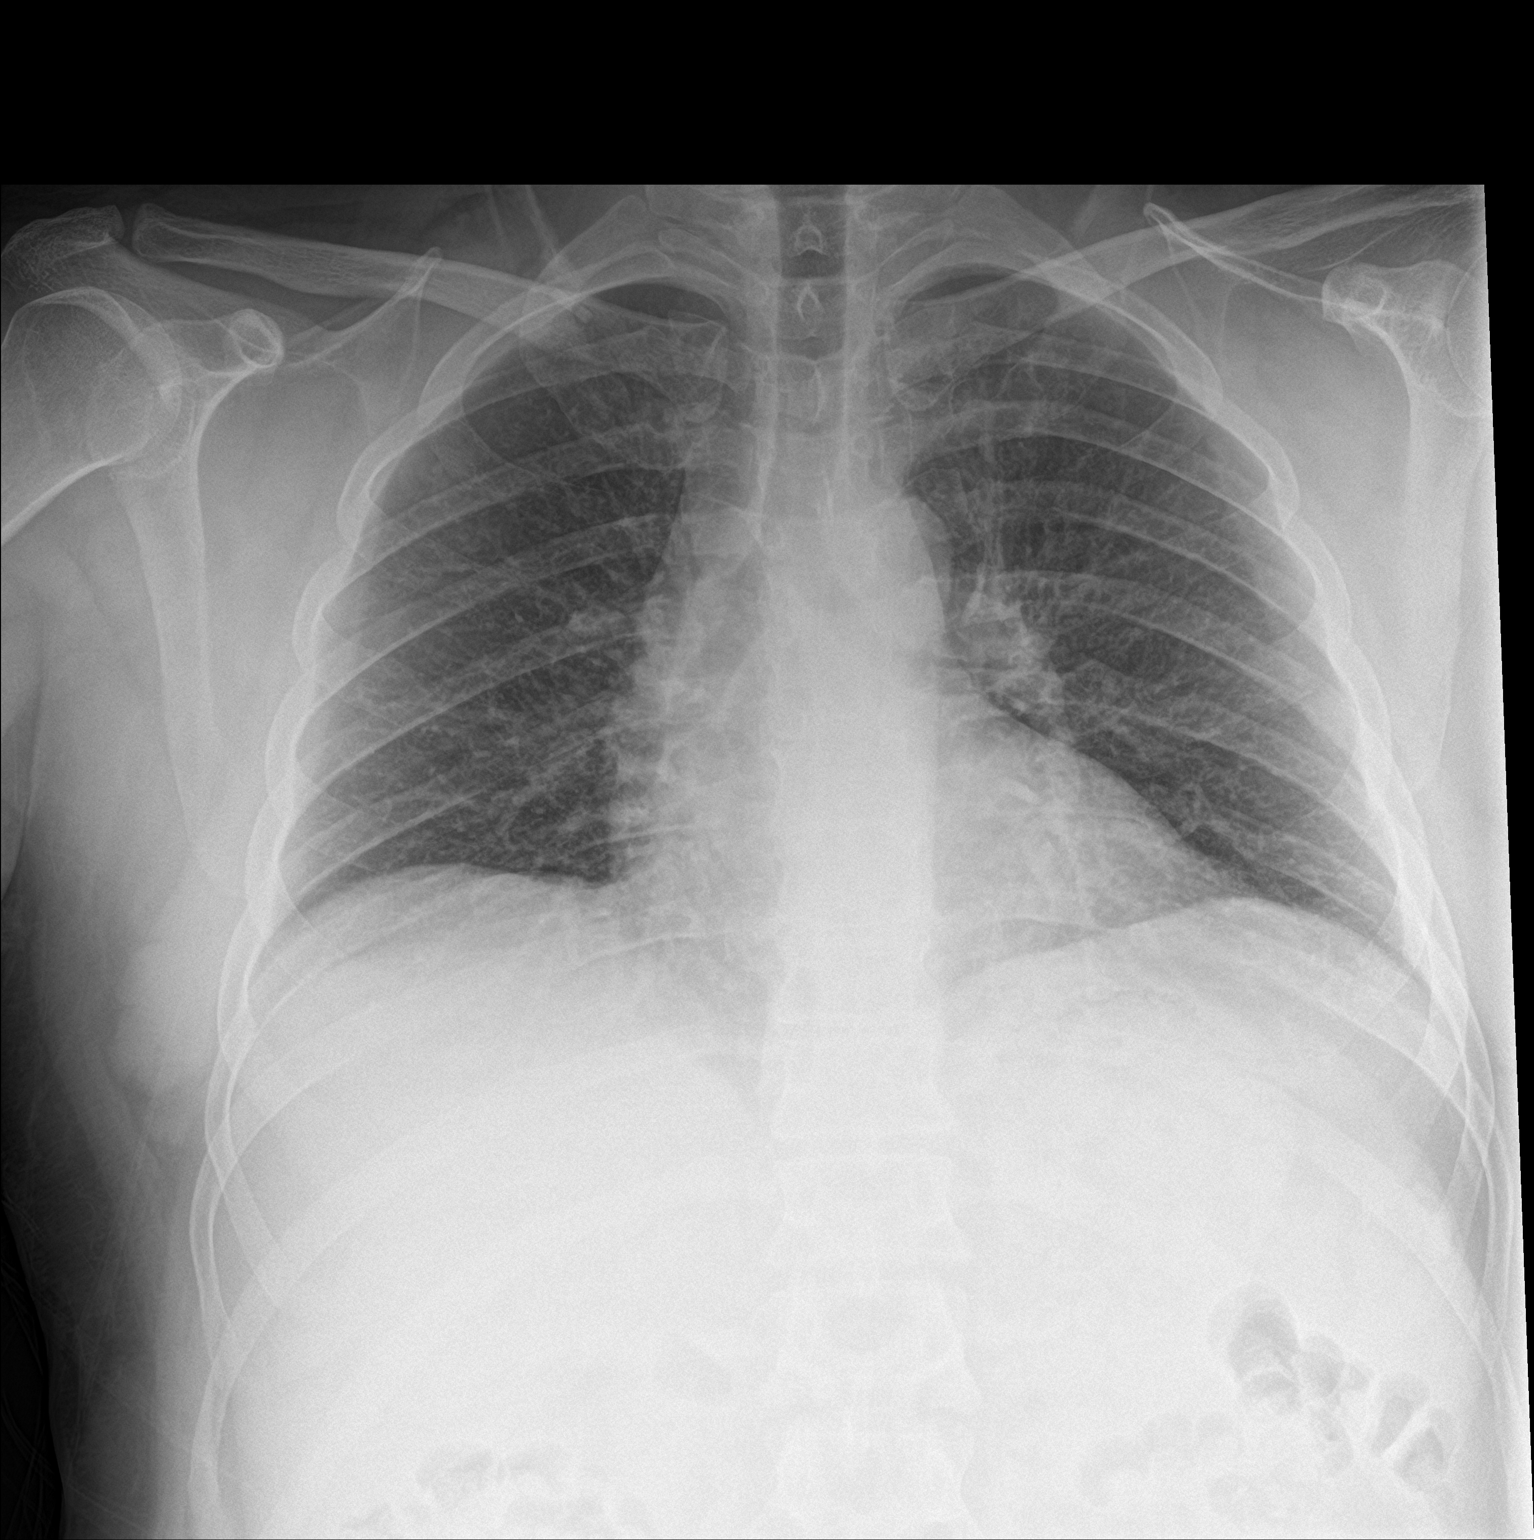

[chest lat]
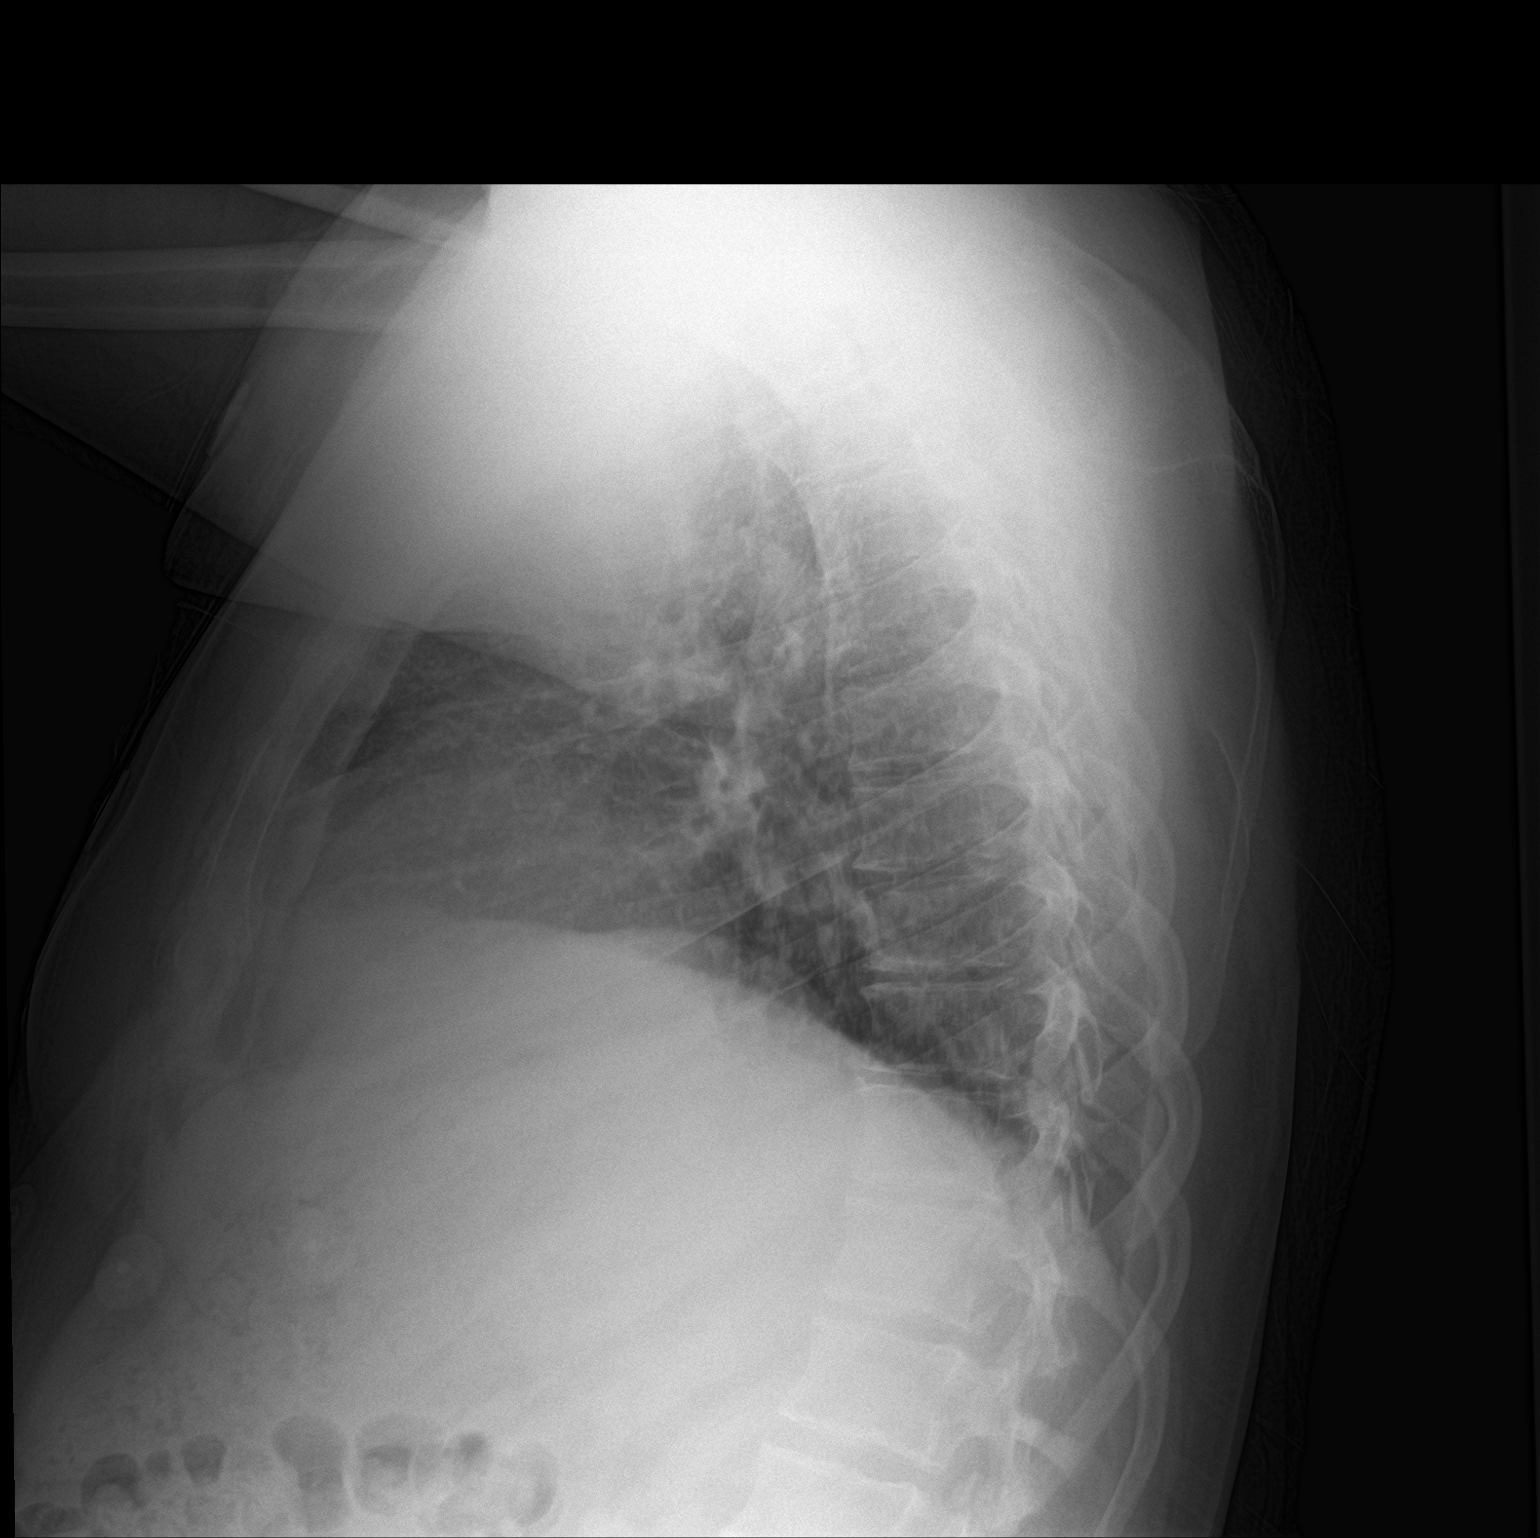

[2 of 2 positions shown; findings below may reference images not displayed]

FINDINGS: The heart size and mediastinal contours are within normal limits.
Both lungs are clear. The visualized skeletal structures are
unremarkable.
IMPRESSION: No active cardiopulmonary disease.

## 2019-07-03 ENCOUNTER — Telehealth: Payer: Self-pay | Admitting: Cardiovascular Disease

## 2019-07-03 NOTE — Telephone Encounter (Signed)
05-02-19 Left message to call and schedule GXT - Reach out again today to schedule.   No answer so left a voice message.   Patient called back to discuss the GXT and don't want to do the test.

## 2019-07-03 NOTE — Telephone Encounter (Signed)
Will let Dr. Eden Emms know that patient did not want to have ETT done.

## 2019-07-19 NOTE — Telephone Encounter (Signed)
Late entry....patient refused see telephone note on 3/31

## 2020-06-05 ENCOUNTER — Telehealth: Payer: Self-pay | Admitting: Cardiovascular Disease

## 2020-06-05 NOTE — Telephone Encounter (Signed)
Tried to call patient. Person on phone stated we had wrong number. Did not see any where on patient's chart where anyone has called patient. Patient was seen over two years ago and was suppose to get testing (GXT) that was canceled twice. Will await for patient to call back.

## 2020-06-05 NOTE — Telephone Encounter (Signed)
Follow Up:      Pt was returning a call, but did not know who had called him.

## 2020-06-11 ENCOUNTER — Encounter: Payer: Self-pay | Admitting: *Deleted

## 2020-06-11 ENCOUNTER — Other Ambulatory Visit: Payer: Self-pay | Admitting: *Deleted

## 2020-06-11 ENCOUNTER — Telehealth: Payer: Self-pay

## 2020-06-11 NOTE — Telephone Encounter (Signed)
After reviewing schedule for Monday and after discussing with Dr. Frances Furbish. Pt has been seen by [facility] for memory loss h/a. Our office had him scheduled for 06/15/20 to discuss the same issue. Pt was seen by Dr. Benjamine Mola on 05/04/20 MRI, meds and SS work up was completed.  I called pt via telephone interpreter service (ID # [phone]) and advised since work up through [facility] has been completed, we could cancel appt for this coming Monday. Pt was agreeable to this plan. Appt c/a and referrals made aware reschedule for this pt is not needful.

## 2020-06-11 NOTE — Telephone Encounter (Signed)
I personally reviewed his chart and realized that this was a duplicate referral.  Thank you for clarifying with the patient and saving him another consultation with a specialist for the same issues.  Follow-up at [facility] is pending as well.

## 2020-06-15 ENCOUNTER — Ambulatory Visit: Payer: BLUE CROSS/BLUE SHIELD | Admitting: Neurology

## 2024-03-31 ENCOUNTER — Ambulatory Visit
Admission: EM | Admit: 2024-03-31 | Discharge: 2024-03-31 | Disposition: A | Discharge status: Home / Self Care | Attending: Family Medicine | Admitting: Family Medicine

## 2024-03-31 DIAGNOSIS — R42 Dizziness and giddiness: Secondary | ICD-10-CM

## 2024-03-31 DIAGNOSIS — H6690 Otitis media, unspecified, unspecified ear: Secondary | ICD-10-CM | POA: Diagnosis not present

## 2024-03-31 MED ORDER — MECLIZINE HCL 25 MG PO TABS: 25.0000 mg | ORAL_TABLET | Freq: Three times a day (TID) | ORAL | 0 refills | Status: DC | PRN

## 2024-03-31 MED ORDER — MECLIZINE HCL 25 MG PO TABS: 25.0000 mg | ORAL_TABLET | Freq: Three times a day (TID) | ORAL | 0 refills | Status: AC | PRN

## 2024-03-31 MED ORDER — FLUTICASONE PROPIONATE 50 MCG/ACT NA SUSP: 1.0000 | Freq: Every day | NASAL | 0 refills | Status: DC

## 2024-03-31 MED ORDER — AMOXICILLIN 875 MG PO TABS: 875.0000 mg | ORAL_TABLET | Freq: Two times a day (BID) | ORAL | 0 refills | Status: AC

## 2024-03-31 MED ORDER — FLUTICASONE PROPIONATE 50 MCG/ACT NA SUSP: 1.0000 | Freq: Every day | NASAL | 0 refills | Status: AC

## 2024-03-31 NOTE — Discharge Instructions (Addendum)
 Start amoxicillin  twice daily for 10 days for your ear infection.  You may take meclizine  for your vertigo/dizziness.  Please note this medication can make you drowsy.  Do not drink alcohol or drive while on this medication.  You may do Flonase  daily as well.  Lots of rest and fluids and follow-up with your PCP in 2 days for recheck.  Please go to the ER for any worsening symptoms.  Hope you feel better soon!

## 2024-03-31 NOTE — ED Triage Notes (Signed)
 Pt present with c/o dizziness and bilateral ear pain x 1 month. Pt states the pain has worsened recently.

## 2024-03-31 NOTE — ED Provider Notes (Addendum)
 " [facility]    CSN: [id number] Arrival date & time: 03/31/24  1310      History   Chief Complaint Chief Complaint  Patient presents with   Ear Pain    HPI Jeffrey Henry is a 53 y.o. male presents for ear pain and vertigo/dizziness.  Interpretation line used as patient speaks Arabic.  Patient reports 1 month of intermittent left ear pain with dizziness that occurs primarily with position change and feels like the room is spinning.  States the dizziness improves throughout the day.  He does endorse today he began developing a headache that prompted him to come in for evaluation.  Also states ear pain is worsened.  Denies any fevers or URI symptoms.  Endorses some throat congestion intermittently.  No drainage from the ear or hearing changes.  Denies nausea vomiting syncope chest pain, shortness of breath, visual changes, neck pain.  States he stays hydrated.  Has not taken any OTC treatments for symptoms.  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 Authorizing Provider  amoxicillin  (AMOXIL ) 875 MG tablet Take 1 tablet (875 mg total) by mouth 2 (two) times daily for 10 days. 03/31/24 04/10/24 Yes Dazia Lippold, Jodi R, NP  ASPIRIN LOW DOSE 81 MG EC tablet  Take 81 mg by mouth daily. 01/14/20   [provider]  cetirizine  (ZYRTEC  ALLERGY) 10 MG tablet Take 1 tablet (10 mg total) by mouth daily. 03/02/19   Avegno, Komlanvi S, FNP  cyanocobalamin 1000 MCG tablet Take by mouth daily.    [provider]  fluticasone  (FLONASE ) 50 MCG/ACT nasal spray Place 1 spray into both nostrils daily. 03/31/24   Shannan Garfinkel, Jodi R, NP  ipratropium (ATROVENT ) 0.06 % nasal spray Place 2 sprays into both nostrils 4 (four) times daily. 02/25/18   Babara Greig GAILS, PA-C  ketotifen  (THERATEARS ALLERGY) 0.025 % ophthalmic solution Place 1 drop into both eyes 2 (two) times daily. 03/02/19   Avegno, Komlanvi S, FNP  meclizine  (ANTIVERT ) 25 MG tablet Take 1 tablet (25 mg total) by mouth 3 (three) times daily as needed for dizziness. 03/31/24   Candise Crabtree, Jodi R, NP  omeprazole  (PRILOSEC) 20 MG capsule Take 1 capsule (20 mg total) by mouth daily. 02/25/18   Babara, Amy V, PA-C  tamsulosin  (FLOMAX ) 0.4 MG CAPS capsule Take 1 capsule (0.4 mg total) by mouth daily. 04/27/17   Dreama Longs, MD    Family History Family History  Problem Relation Age of Onset   Diabetes Mother    Heart disease Mother    Hyperlipidemia Mother    Hypertension Mother    Rheum arthritis Mother  Diabetes Father    Peripheral Artery Disease Father    Hypertension Father    Heart disease Father    Heart attack Father    Stroke Father     Social History Social History[1]   Allergies   Patient has no known allergies.   Review of Systems Review of Systems  HENT:  Positive for ear pain.   Neurological:  Positive for dizziness and headaches.     Physical Exam Triage Vital Signs ED Triage Vitals  Encounter Vitals Group     BP 03/31/24 1442 119/77     Girls Systolic BP Percentile --      Girls Diastolic BP Percentile --      Boys Systolic BP Percentile --      Boys Diastolic BP Percentile --      Pulse Rate 03/31/24 1442 67     Resp 03/31/24 1442 18     Temp 03/31/24 1442 98.1 F  (36.7 C)     Temp src --      SpO2 03/31/24 1442 96 %     Weight --      Height --      Head Circumference --      Peak Flow --      Pain Score 03/31/24 1441 5     Pain Loc --      Pain Education --      Exclude from Growth Chart --    No data found.  Updated Vital Signs BP 119/77   Pulse 67   Temp 98.1 F (36.7 C)   Resp 18   SpO2 96%   Visual Acuity Right Eye Distance:   Left Eye Distance:   Bilateral Distance:    Right Eye Near:   Left Eye Near:    Bilateral Near:     Physical Exam Vitals and nursing note reviewed.  Constitutional:      General: He is not in acute distress.    Appearance: Normal appearance. He is not ill-appearing.  HENT:     Head: Normocephalic and atraumatic.     Right Ear: Tympanic membrane and ear canal normal.     Left Ear: Ear canal normal. No mastoid tenderness. Tympanic membrane is erythematous. Tympanic membrane is not retracted or bulging.  Eyes:     Extraocular Movements: Extraocular movements intact.     Conjunctiva/sclera: Conjunctivae normal.     Pupils: Pupils are equal, round, and reactive to light.  Cardiovascular:     Rate and Rhythm: Normal rate and regular rhythm.     Heart sounds: Normal heart sounds.  Pulmonary:     Effort: Pulmonary effort is normal.     Breath sounds: Normal breath sounds.  Skin:    General: Skin is warm and dry.  Neurological:     General: No focal deficit present.     Mental Status: He is alert and oriented to person, place, and time.     GCS: GCS eye subscore is 4. GCS verbal subscore is 5. GCS motor subscore is 6.     Cranial Nerves: No facial asymmetry.     Motor: No weakness.     Coordination: Romberg sign negative.     Gait: Tandem walk normal.  Psychiatric:        Mood and Affect: Mood normal.        Behavior: Behavior normal.      UC Treatments / Results  Labs (all labs ordered are listed, but only abnormal results are displayed)  Labs Reviewed - No data to  display  EKG   Radiology No results found.  Procedures Procedures (including critical care time)  Medications Ordered in UC Medications - No data to display  Initial Impression / Assessment and Plan / UC Course  I have reviewed the triage vital signs and the nursing notes.  Pertinent labs & imaging results that were available during my care of the patient were reviewed by me and considered in my medical decision making (see chart for details).     Reviewed exam and symptoms with patient.  No red flags.  Discussed otitis media with likely vertigo caused by this.  Will do amoxicillin  twice daily for 10 days.  Will do meclizine  as needed for vertigo, side effect profile reviewed.  His neurological exam is reassuring.  Flonase  daily as well.  Advised PCP follow-up 2 days for recheck.  ER precautions reviewed and patient verbalized understanding. Final Clinical Impressions(s) / UC Diagnoses   Final diagnoses:  Acute otitis media, unspecified otitis media type  Vertigo     Discharge Instructions      Start amoxicillin  twice daily for 10 days for your ear infection.  You may take meclizine  for your vertigo/dizziness.  Please note this medication can make you drowsy.  Do not drink alcohol or drive while on this medication.  You may do Flonase  daily as well.  Lots of rest and fluids and follow-up with your PCP in 2 days for recheck.  Please go to the ER for any worsening symptoms.  Hope you feel better soon!    ED Prescriptions     Medication Sig Dispense Auth. Provider   amoxicillin  (AMOXIL ) 875 MG tablet Take 1 tablet (875 mg total) by mouth 2 (two) times daily for 10 days. 20 tablet Ladawn Boullion, Jodi R, NP   meclizine  (ANTIVERT ) 25 MG tablet  (Status: Discontinued) Take 1 tablet (25 mg total) by mouth 3 (three) times daily as needed for dizziness. 30 tablet Mardella Nuckles, Jodi R, NP   fluticasone  (FLONASE ) 50 MCG/ACT nasal spray  (Status: Discontinued) Place 1 spray into both nostrils daily.  15.8 mL Harumi Yamin, Jodi R, NP   fluticasone  (FLONASE ) 50 MCG/ACT nasal spray Place 1 spray into both nostrils daily. 15.8 mL Anice Wilshire, Jodi R, NP   meclizine  (ANTIVERT ) 25 MG tablet Take 1 tablet (25 mg total) by mouth 3 (three) times daily as needed for dizziness. 30 tablet Aryon Nham, Jodi R, NP      PDMP not reviewed this encounter.    Loreda Myla SAUNDERS, NP 03/31/24 1520     [1]  Social History Tobacco Use   Smoking status: Some Days    Current packs/day: 0.25    Average packs/day: 0.3 packs/day for 10.0 years (2.5 ttl pk-yrs)    Types: Cigarettes   Smokeless tobacco: Current   Tobacco comments:    smokes hookah daily  Vaping Use   Vaping status: Never Used  Substance Use Topics   Alcohol use: No   Drug use: No     Loreda Myla SAUNDERS, NP 03/31/24 1521  "
